# Patient Record
Sex: Female | Born: 1965 | Race: White | Hispanic: No | Marital: Married | Smoking: Former smoker
Health system: Southern US, Community
[De-identification: ages and names within clinical notes are randomized; demographics above are authoritative.]

## PROBLEM LIST (undated history)

## (undated) DIAGNOSIS — E039 Hypothyroidism, unspecified: Secondary | ICD-10-CM

## (undated) DIAGNOSIS — Z973 Presence of spectacles and contact lenses: Secondary | ICD-10-CM

## (undated) DIAGNOSIS — N950 Postmenopausal bleeding: Secondary | ICD-10-CM

## (undated) DIAGNOSIS — Z974 Presence of external hearing-aid: Secondary | ICD-10-CM

## (undated) DIAGNOSIS — H9193 Unspecified hearing loss, bilateral: Secondary | ICD-10-CM

## (undated) DIAGNOSIS — N840 Polyp of corpus uteri: Secondary | ICD-10-CM

## (undated) DIAGNOSIS — H9190 Unspecified hearing loss, unspecified ear: Secondary | ICD-10-CM

## (undated) DIAGNOSIS — E042 Nontoxic multinodular goiter: Secondary | ICD-10-CM

## (undated) HISTORY — DX: Hypothyroidism, unspecified: E03.9

## (undated) HISTORY — PX: BIOPSY THYROID: PRO38

## (undated) HISTORY — DX: Unspecified hearing loss, unspecified ear: H91.90

## (undated) HISTORY — PX: KNEE SURGERY: SHX244

---

## 1998-09-08 ENCOUNTER — Other Ambulatory Visit: Admission: RE | Admit: 1998-09-08 | Discharge: 1998-09-08 | Payer: Self-pay | Admitting: Obstetrics and Gynecology

## 1999-09-12 ENCOUNTER — Other Ambulatory Visit: Admission: RE | Admit: 1999-09-12 | Discharge: 1999-09-12 | Payer: Self-pay | Admitting: Obstetrics and Gynecology

## 2000-03-20 ENCOUNTER — Other Ambulatory Visit: Admission: RE | Admit: 2000-03-20 | Discharge: 2000-03-20 | Payer: Self-pay | Admitting: Gynecology

## 2000-06-19 ENCOUNTER — Other Ambulatory Visit: Admission: RE | Admit: 2000-06-19 | Discharge: 2000-06-19 | Payer: Self-pay | Admitting: Gynecology

## 2000-10-05 ENCOUNTER — Inpatient Hospital Stay (HOSPITAL_COMMUNITY): Admission: AD | Admit: 2000-10-05 | Discharge: 2000-10-08 | Payer: Self-pay | Admitting: Gynecology

## 2001-11-19 ENCOUNTER — Other Ambulatory Visit: Admission: RE | Admit: 2001-11-19 | Discharge: 2001-11-19 | Payer: Self-pay | Admitting: Gynecology

## 2003-04-06 ENCOUNTER — Other Ambulatory Visit: Admission: RE | Admit: 2003-04-06 | Discharge: 2003-04-06 | Payer: Self-pay | Admitting: Gynecology

## 2004-04-19 ENCOUNTER — Other Ambulatory Visit: Admission: RE | Admit: 2004-04-19 | Discharge: 2004-04-19 | Payer: Self-pay | Admitting: Gynecology

## 2005-07-26 ENCOUNTER — Other Ambulatory Visit: Admission: RE | Admit: 2005-07-26 | Discharge: 2005-07-26 | Payer: Self-pay | Admitting: Gynecology

## 2005-08-08 ENCOUNTER — Encounter: Admission: RE | Admit: 2005-08-08 | Discharge: 2005-08-08 | Payer: Self-pay | Admitting: General Surgery

## 2005-08-08 ENCOUNTER — Other Ambulatory Visit: Admission: RE | Admit: 2005-08-08 | Discharge: 2005-08-08 | Payer: Self-pay | Admitting: Diagnostic Radiology

## 2005-08-08 ENCOUNTER — Encounter (INDEPENDENT_AMBULATORY_CARE_PROVIDER_SITE_OTHER): Payer: Self-pay | Admitting: *Deleted

## 2005-08-30 ENCOUNTER — Ambulatory Visit: Payer: Self-pay | Admitting: Endocrinology

## 2005-10-03 ENCOUNTER — Ambulatory Visit: Payer: Self-pay | Admitting: Endocrinology

## 2006-01-03 ENCOUNTER — Ambulatory Visit: Payer: Self-pay | Admitting: Endocrinology

## 2006-01-11 ENCOUNTER — Ambulatory Visit: Payer: Self-pay | Admitting: Endocrinology

## 2006-09-04 HISTORY — PX: OTHER SURGICAL HISTORY: SHX169

## 2006-09-06 ENCOUNTER — Ambulatory Visit (HOSPITAL_BASED_OUTPATIENT_CLINIC_OR_DEPARTMENT_OTHER): Admission: RE | Admit: 2006-09-06 | Discharge: 2006-09-06 | Payer: Self-pay | Admitting: Gynecology

## 2006-09-06 ENCOUNTER — Encounter (INDEPENDENT_AMBULATORY_CARE_PROVIDER_SITE_OTHER): Payer: Self-pay | Admitting: Specialist

## 2006-09-06 HISTORY — PX: HYSTEROSCOPY WITH D & C: SHX1775

## 2006-09-19 ENCOUNTER — Other Ambulatory Visit: Admission: RE | Admit: 2006-09-19 | Discharge: 2006-09-19 | Payer: Self-pay | Admitting: Gynecology

## 2006-10-24 ENCOUNTER — Ambulatory Visit: Payer: Self-pay | Admitting: Endocrinology

## 2006-11-15 ENCOUNTER — Encounter: Admission: RE | Admit: 2006-11-15 | Discharge: 2006-11-15 | Payer: Self-pay | Admitting: Endocrinology

## 2007-06-20 ENCOUNTER — Encounter: Payer: Self-pay | Admitting: Endocrinology

## 2007-06-20 DIAGNOSIS — E039 Hypothyroidism, unspecified: Secondary | ICD-10-CM

## 2007-06-20 HISTORY — DX: Hypothyroidism, unspecified: E03.9

## 2007-10-24 HISTORY — PX: KNEE ARTHROSCOPY: SUR90

## 2009-01-06 ENCOUNTER — Other Ambulatory Visit: Admission: RE | Admit: 2009-01-06 | Discharge: 2009-01-06 | Payer: Self-pay | Admitting: Gynecology

## 2009-01-06 ENCOUNTER — Encounter: Payer: Self-pay | Admitting: Gynecology

## 2009-01-06 ENCOUNTER — Ambulatory Visit: Payer: Self-pay | Admitting: Gynecology

## 2009-01-06 ENCOUNTER — Encounter: Payer: Self-pay | Admitting: Endocrinology

## 2009-01-13 ENCOUNTER — Encounter: Payer: Self-pay | Admitting: Endocrinology

## 2009-02-04 ENCOUNTER — Ambulatory Visit: Payer: Self-pay | Admitting: Endocrinology

## 2009-02-04 LAB — CONVERTED CEMR LAB: TSH: 6.28 microintl units/mL — ABNORMAL HIGH (ref 0.35–5.50)

## 2010-04-15 ENCOUNTER — Ambulatory Visit: Payer: Self-pay | Admitting: Endocrinology

## 2010-05-16 ENCOUNTER — Ambulatory Visit: Payer: Self-pay | Admitting: Endocrinology

## 2010-05-19 LAB — CONVERTED CEMR LAB: TSH: 3.27 microintl units/mL (ref 0.35–5.50)

## 2010-11-22 NOTE — Assessment & Plan Note (Signed)
Summary: YEARLY FU/NWS   Vital Signs:  Patient profile:   45 year old female Height:      63 inches (160.02 cm) Weight:      151 pounds (68.64 kg) BMI:     26.85 O2 Sat:      98 % on Room air Temp:     98.1 degrees F (36.72 degrees C) oral Pulse rate:   51 / minute BP sitting:   114 / 70  (left arm) Cuff size:   regular  Vitals Entered By: Brenton Grills MA (April 15, 2010 10:41 AM)  O2 Flow:  Room air CC: yearly f/u/refill request on levothyroxine/aj   CC:  yearly f/u/refill request on levothyroxine/aj.  History of Present Illness: pt ran out of her synthroid 2 mos ago.  pt states she feels no different, and well in general.  Current Medications (verified): 1)  Levothyroxine Sodium 75 Mcg Tabs (Levothyroxine Sodium) .Marland Kitchen.. 1 Qd  Allergies (verified): No Known Drug Allergies  Past History:  Past Medical History: Last updated: 06/20/2007 Hypothyroidism  Review of Systems       no fatigue  Physical Exam  General:  normal appearance.   Neck:  there is slight prominence at the left thyroid, but i do not appreciate a nodule   Impression & Recommendations:  Problem # 1:  HYPOTHYROIDISM (ICD-244.9) therapy limited by noncompliance.  i'll do the best i can.  Other Orders: Est. Patient Level III (16109)  Patient Instructions: 1)  resume levothyroxine 75 micrograms/day. 2)  in 1 month, go to lab for tsh 244.9.  please call [phone] to hear your test results. 3)  Please schedule a follow-up appointment in 1 year. 4)  cc dr Lily Peer. Prescriptions: LEVOTHYROXINE SODIUM 75 MCG TABS (LEVOTHYROXINE SODIUM) 1 qd  #90 x 3   Entered and Authorized by:   Minus Breeding MD   Signed by:   Minus Breeding MD on 04/15/2010   Method used:   Electronically to        [facility]. [phone]* (retail)       [id number]       [place], [place]  [address]       Ph: [phone]       Fax: [phone]   RxID:   [id number]

## 2011-03-10 NOTE — Discharge Summary (Signed)
[facility]  Patient:    Kimberly Hill, Kimberly Hill                       MRN: [id number] Adm. Date:  [id number] Disc. Date: [id number] Attending:  Tonye Royalty Dictator:   Antony Contras, [facility]                           Discharge Summary  DISCHARGE DIAGNOSES:          1. Intrauterine pregnancy at 39+ weeks.                               2. History of advanced maternal age.                               3. History of bilateral hearing deficit.  PROCEDURES:                   Normal spontaneous vaginal delivery of viable                               infant over midline episiotomy.  HISTORY OF PRESENT ILLNESS:   Patient is a 45 year old gravida 2, para 1-0-0-1 with LMP January 05, 2000, [facility] October 11, 2000. Prenatal risk factors included advanced maternal age, bilateral hearing deficit.  PRENATAL LABORATORY DATA:     Blood type A positive, antibody screen negative. RPR, HBSAG, and HIV nonreactive. Patient did decline both MSAFP and amniocentesis. GBS is negative.  HOSPITAL COURSE/TREATMENT:    Patient was admitted on October 06, 2000 with spontaneous onset of labor. Cervix was 3-4 cm, 90%, and -1 station. She did progress to complete dilatation and was delivered on an Apgar 8/9 female infant weighing 8 pounds 4 ounces over a midline episiotomy.  POSTPARTUM COURSE:            Normal except for gestational thrombocytopenia. CBC: hematocrit 30.9, hemoglobin 11.2, platelets 102,000, but she remained afebrile and had no difficulty voiding, and was able to be discharged on her second postpartum day in satisfactory condition.  DISPOSITION:                  Follow up in six weeks. Continue prenatal vitamins and iron. Motrin/Tylox for pain. Also, will check platelets at postpartum visit with postpartum CBC. DD:  11/05/00 TD:  11/05/00 Job: [id number]

## 2011-03-10 NOTE — Op Note (Signed)
Kimberly Hill, Kimberly Hill              ACCOUNT NO.:  [id number]   MEDICAL RECORD NO.:  [id number]          PATIENT TYPE:  AMB   LOCATION:  [facility]                         FACILITY:  [facility]   PHYSICIAN:  Juan H. Lily Peer, M.D.DATE OF BIRTH:  [birth year]   DATE OF PROCEDURE:  09/06/2006  DATE OF DISCHARGE:                                 OPERATIVE REPORT   SURGEON:  Juan H. Lily Peer, M.D.   INDICATIONS FOR OPERATION:  45 year old gravida 2, para 2 with  menomenorrhagia.  Workup consisting of a benign endometrial biopsy but a  sonohysterogram demonstrated two endometrial polyps.   PREOPERATIVE DIAGNOSIS:  1. Menomenorrhagia.  2. Endometrial polyps.   POSTOPERATIVE DIAGNOSIS:  1. Menomenorrhagia.  2. Endometrial polyps.   ANESTHESIA:  General endotracheal anesthesia.   PROCEDURE PERFORMED:  1. Resectoscopic polypectomy.  2. Excision of a cervical polyp.   FINDINGS:  The patient had a small polyp protruding through the external  cervical os and had two larger polyps in the endometrial cavity.  One near  the lower uterine segment and the other one in midportion of the uterus  measured approximately to 2.5 cm.  Both tubal ostia were identified.   DESCRIPTION OF OPERATION:  After the patient was adequately counseled she  was taken to the operating room where she underwent general endotracheal  anesthesia. She had received a gram of cefoxitin for prophylaxis.  The  vagina and perineum were prepped and draped in usual sterile fashion.  A red  rubber Roxan Hockey had been utilized to evacuate the bladder of its contents  for approximately 75 mL.  Examination under anesthesia demonstrated  anteverted uterus with no palpable adnexal masses.  The short weighted  billed speculum was placed in a posterior vaginal vault and a single-tooth  tenaculum was placed on the anterior cervical lip.  Previously placed  laminaria was removed thus requiring minimal dilatation with a Pratt dilator  to size 27.   Following this, the operative resectoscope with a 90 degrees  wire loop was inserted into the intrauterine cavity.  3% sorbitol was  distending media.  Findings as described above.  The [facility]  surgical generator was set at 80 watts on cutting and 80 watts on  coagulation and the individual polyps were excised as well as the one in the  cervical canal.  All these were submitted for pathological evaluation.  The  uterus was then curetted vigorously with a blunt curette and followed by  suction curette to remove all the fragments.  Inspection of the cavity  demonstrated clear cavity with complete removal of all  the polyps.  The patient tolerated procedure well, was extubated and  transferred to recovery with stable vital signs.  Blood loss was minimal and  fluid resuscitation consisted of 1100 mL of lactated Ringer's.  Fluid  deficit was 50 mL.  The patient to receive Toradol 30 mg IV en route to the  recovery room.      Juan H. Lily Peer, M.D.  Electronically Signed     JHF/MEDQ  D:  09/06/2006  T:  09/06/2006  Job:  [id number]

## 2011-03-10 NOTE — H&P (Signed)
Kimberly Hill, PIETSCH              ACCOUNT NO.:  [id number]   MEDICAL RECORD NO.:  [id number]          PATIENT TYPE:  AMB   LOCATION:  [facility]                         FACILITY:  [facility]   PHYSICIAN:  Juan H. Lily Peer, M.D.DATE OF BIRTH:  [birth year]   DATE OF ADMISSION:  09/06/2006  DATE OF DISCHARGE:                                HISTORY & PHYSICAL   The patient is scheduled for surgery on Thursday, September 06, 2006, at 7:30  a.m. at [facility].  Please have History and Physical  available.   CHIEF COMPLAINT:  1. Dysfunctional uterine bleeding.  2. Endometrial polyp.   HISTORY:  The patient is a 45 year old, gravida 2, para 2, who stopped oral  contraceptive pills last year.  She is using Customer service manager contraception. Was  evaluated for dysfunctional uterine bleeding.  Had a TSH and prolactin  evaluated. She is being followed by endocrinologist for her hypothyroidism  for which she was on Synthroid 50 mcg daily.  Her evaluation had consisted  of an endometrial biopsy which was done in the office on August 24 of this  year which demonstrated proliferative secretory glands with shedding  consistent with __________ endometrium, no evidence of hyperplasia or  malignancy.  She subsequently underwent a sonohistogram in the office which  had demonstrated in September 2007 two endometrial polyps, one measuring 27  x 26 mm and the second one 10 x 7 mm in the lower uterine segment. She did  have a left complex cyst with septation in the left ovary measuring 25 x 17  x 22 mm, and subsequently on the ultrasound done today, complete resolution  of the ovarian cyst, and the ovaries were normal with exception of the  endometrial polyp.  The patient is scheduled to undergo resectoscopic  polypectomy tomorrow, November 15, at [facility].   PAST MEDICAL HISTORY:  She denies any allergies.  She has had 2 normal  spontaneous vaginal deliveries.  She has a history of  hypothyroidism on  Synthroid 50 mcg daily.  Prior surgeries have consisted of knee surgery,  thyroid needle biopsy which was benign, and resectoscopic polypectomy.  The  patient has decreased hearing in both ears and uses hearing aid as well.   FAMILY HISTORY:  Father with history of hypertension.   PHYSICAL EXAMINATION:  GENERAL:  Well-developed, well-nourished female.  VITAL SIGNS:  Average weight 132 pounds. Blood pressure 120/70.  HEENT:  Unremarkable.  NECK:  Supple.  Trachea midline.  No carotid bruits, no thyromegaly.  LUNGS: Clear to auscultation without rhonchi or wheezes.  HEART: Regular rate and rhythm.  No murmurs, rubs, or gallops.  BREASTS: Exam not done.  ABDOMEN: Soft, nontender without rebound or guarding.  PELVIC:  Bartholin, urethra, Skene within normal limits.  Vagina and cervix:  No lesions or discharge.  Uterus anteverted, normal size, shape and  consistency.  Adnexa without mass or tenderness.  RECTAL: Exam deferred.   ASSESSMENT:  A 45 year old gravida 2, para 2, with Bayer contraception,  history of hypothyroidism on Synthroid 50 mcg daily with dysfunctional  uterine bleeding.  Workup demonstrated normal endometrial  biopsy.  Sonohistogram demonstrated intracavitary polyps, one measuring 27 x 26 mm  and the second one 10 x 7 mm.  The patient had laminaria placed in the  office today. She was given a prescription for pain medication such as  Lortab 7.5/500 to take 1 p.o. q. 4-6 h p.r.n. if she needs it tonight and  then after her surgery tomorrow.  Risks, benefits, pros and cons were  discussed.  She is scheduled to undergo resectoscopic polypectomy at [facility].  All questions were answered and will follow  accordingly.   PLAN:  As discussed above.      Juan H. Lily Peer, M.D.  Electronically Signed     JHF/MEDQ  D:  09/05/2006  T:  09/05/2006  Job:  [id number]

## 2012-03-06 ENCOUNTER — Ambulatory Visit: Payer: Self-pay | Admitting: Endocrinology

## 2012-05-23 ENCOUNTER — Encounter: Payer: Self-pay | Admitting: Endocrinology

## 2012-06-21 ENCOUNTER — Ambulatory Visit (INDEPENDENT_AMBULATORY_CARE_PROVIDER_SITE_OTHER): Payer: BC Managed Care – PPO | Admitting: Endocrinology

## 2012-06-21 ENCOUNTER — Other Ambulatory Visit (INDEPENDENT_AMBULATORY_CARE_PROVIDER_SITE_OTHER): Payer: BC Managed Care – PPO

## 2012-06-21 ENCOUNTER — Encounter: Payer: Self-pay | Admitting: Endocrinology

## 2012-06-21 VITALS — BP 114/80 | HR 48 | Temp 97.9°F | Ht 63.0 in | Wt 156.0 lb

## 2012-06-21 DIAGNOSIS — E039 Hypothyroidism, unspecified: Secondary | ICD-10-CM

## 2012-06-21 DIAGNOSIS — E042 Nontoxic multinodular goiter: Secondary | ICD-10-CM | POA: Insufficient documentation

## 2012-06-21 DIAGNOSIS — H9190 Unspecified hearing loss, unspecified ear: Secondary | ICD-10-CM

## 2012-06-21 MED ORDER — LEVOTHYROXINE SODIUM 50 MCG PO TABS: 50.0000 ug | ORAL_TABLET | Freq: Every day | ORAL | Status: DC

## 2012-06-21 NOTE — Patient Instructions (Addendum)
blood tests are being requested for you today.  You will receive a letter with results. Based on the results, i will probably prescribe for you a refill of the levothyroxine.

## 2012-06-21 NOTE — Progress Notes (Signed)
  Subjective:    Patient ID: Kimberly Hill, female    DOB: 06-03-1966, 46 y.o.   MRN: [id number]  HPI Pt returns for f/u of chronic hypothyroidism (dx'ed 2008).  pt states she feels well in general.  She has not recently taken synthroid.  She has regular menses. Past Medical History  Diagnosis Date  . HYPOTHYROIDISM 06/20/2007    Qualifier: Diagnosis of  By: Dance CMA (AAMA), Kim      No past surgical history on file.  History   Social History  . Marital Status: Married    Spouse Name: N/A    Number of Children: N/A  . Years of Education: N/A   Occupational History  . Not on file.   Social History Main Topics  . Smoking status: Former Games developer  . Smokeless tobacco: Not on file  . Alcohol Use: Not on file  . Drug Use: Not on file  . Sexually Active: Not on file   Other Topics Concern  . Not on file   Social History Narrative  . No narrative on file    Current Outpatient Prescriptions on File Prior to Visit  Medication Sig Dispense Refill  . levothyroxine (SYNTHROID, LEVOTHROID) 50 MCG tablet Take 1 tablet (50 mcg total) by mouth daily.  30 tablet  11    No Known Allergies  No family history on file.  BP 114/80  Pulse 48  Temp 97.9 F (36.6 C) (Oral)  Ht 5\' 3"  (1.6 m)  Wt 156 lb (70.761 kg)  BMI 27.63 kg/m2  SpO2 97%  LMP 05/23/2012    Review of Systems She denies fatigue.    Objective:   Physical Exam VITAL SIGNS:  See vs page GENERAL: no distress NECK: There is no palpable thyroid enlargement.  No thyroid nodule is palpable.  No palpable lymphadenopathy at the anterior neck.  Lab Results  Component Value Date   TSH 10.91* 06/21/2012      Assessment & Plan:  Chronic hypothyroidism, therapy limited by noncompliance.  i'll do the best i can.

## 2012-07-24 ENCOUNTER — Other Ambulatory Visit: Payer: Self-pay | Admitting: Endocrinology

## 2012-07-24 ENCOUNTER — Other Ambulatory Visit (INDEPENDENT_AMBULATORY_CARE_PROVIDER_SITE_OTHER): Payer: BC Managed Care – PPO

## 2012-07-24 DIAGNOSIS — Z0000 Encounter for general adult medical examination without abnormal findings: Secondary | ICD-10-CM

## 2012-07-24 LAB — TSH: TSH: 5.89 u[IU]/mL — ABNORMAL HIGH (ref 0.35–5.50)

## 2012-07-24 MED ORDER — LEVOTHYROXINE SODIUM 75 MCG PO TABS: 75.0000 ug | ORAL_TABLET | Freq: Every day | ORAL | Status: DC

## 2012-11-07 ENCOUNTER — Encounter: Payer: Self-pay | Admitting: Anesthesiology

## 2012-11-12 ENCOUNTER — Encounter: Payer: Self-pay | Admitting: Gynecology

## 2012-11-12 ENCOUNTER — Ambulatory Visit (INDEPENDENT_AMBULATORY_CARE_PROVIDER_SITE_OTHER): Payer: BC Managed Care – PPO | Admitting: Gynecology

## 2012-11-12 ENCOUNTER — Other Ambulatory Visit (HOSPITAL_COMMUNITY)
Admission: RE | Admit: 2012-11-12 | Discharge: 2012-11-12 | Disposition: A | Discharge status: Home / Self Care | Payer: BC Managed Care – PPO | Source: Ambulatory Visit | Attending: Gynecology | Admitting: Gynecology

## 2012-11-12 VITALS — BP 124/82 | Ht 62.0 in | Wt 154.0 lb

## 2012-11-12 DIAGNOSIS — Z01419 Encounter for gynecological examination (general) (routine) without abnormal findings: Secondary | ICD-10-CM | POA: Insufficient documentation

## 2012-11-12 DIAGNOSIS — Z1151 Encounter for screening for human papillomavirus (HPV): Secondary | ICD-10-CM | POA: Insufficient documentation

## 2012-11-12 DIAGNOSIS — R635 Abnormal weight gain: Secondary | ICD-10-CM

## 2012-11-12 LAB — CBC WITH DIFFERENTIAL/PLATELET
Basophils Absolute: 0 10*3/uL (ref 0.0–0.1)
Basophils Relative: 0 % (ref 0–1)
Eosinophils Absolute: 0 10*3/uL (ref 0.0–0.7)
Eosinophils Relative: 1 % (ref 0–5)
HCT: 40.5 % (ref 36.0–46.0)
Hemoglobin: 13.7 g/dL (ref 12.0–15.0)
Lymphocytes Relative: 18 % (ref 12–46)
Lymphs Abs: 1.3 10*3/uL (ref 0.7–4.0)
MCH: 30 pg (ref 26.0–34.0)
MCHC: 33.8 g/dL (ref 30.0–36.0)
MCV: 88.6 fL (ref 78.0–100.0)
Monocytes Absolute: 0.4 10*3/uL (ref 0.1–1.0)
Monocytes Relative: 6 % (ref 3–12)
Neutro Abs: 5.2 10*3/uL (ref 1.7–7.7)
Neutrophils Relative %: 75 % (ref 43–77)
Platelets: 221 10*3/uL (ref 150–400)
RBC: 4.57 MIL/uL (ref 3.87–5.11)
RDW: 13.9 % (ref 11.5–15.5)
WBC: 7 10*3/uL (ref 4.0–10.5)

## 2012-11-12 LAB — GLUCOSE, RANDOM: Glucose, Bld: 90 mg/dL (ref 70–99)

## 2012-11-12 LAB — LIPID PANEL
Cholesterol: 199 mg/dL (ref 0–200)
HDL: 57 mg/dL (ref >39)
LDL Cholesterol: 124 mg/dL — ABNORMAL HIGH (ref 0–99)
Total CHOL/HDL Ratio: 3.5 Ratio
Triglycerides: 89 mg/dL (ref <150)
VLDL: 18 mg/dL (ref 0–40)

## 2012-11-12 NOTE — Patient Instructions (Addendum)

## 2012-11-12 NOTE — Progress Notes (Signed)
Kimberly Hill 20-Mar-1966 [id number]   History:    47 y.o.  for annual gyn exam who has not been seen the office since 2010. Review of her record indicated that she was being followed by Dr. Everardo All endocrinologist for her multinodular goiter. Patient with past history of benign thyroid biopsy. She is currently on Synthroid 75 mcg daily. She had her thyroid medication adjusted and seen by her endocrinologist and is scheduled to see him in March of this year. Her last mammogram was 4 years ago. Patient with no past history of abnormal Pap smears. Patient declined flu vaccine. Patient is not using any form of contraception. Patient with past history resectoscopic polypectomy in 2007.  Past medical history,surgical history, family history and social history were all reviewed and documented in the EPIC chart.  Gynecologic History Patient's last menstrual period was 10/29/2012. Contraception: none Last Pap: 2010. Results were: normal Last mammogram: 2010. Results were: normal  Obstetric History OB History    Grav Para Term Preterm Abortions TAB SAB Ect Mult Living   2 2        2      # Outc Date GA Lbr Len/2nd Wgt Sex Del Anes PTL Lv   1 PAR            2 PAR                ROS: A ROS was performed and pertinent positives and negatives are included in the history.  GENERAL: No fevers or chills. HEENT: No change in vision, no earache, sore throat or sinus congestion. NECK: No pain or stiffness. CARDIOVASCULAR: No chest pain or pressure. No palpitations. PULMONARY: No shortness of breath, cough or wheeze. GASTROINTESTINAL: No abdominal pain, nausea, vomiting or diarrhea, melena or bright red blood per rectum. GENITOURINARY: No urinary frequency, urgency, hesitancy or dysuria. MUSCULOSKELETAL: No joint or muscle pain, no back pain, no recent trauma. DERMATOLOGIC: No rash, no itching, no lesions. ENDOCRINE: No polyuria, polydipsia, no heat or cold intolerance. No recent change in weight. HEMATOLOGICAL:  No anemia or easy bruising or bleeding. NEUROLOGIC: No headache, seizures, numbness, tingling or weakness. PSYCHIATRIC: No depression, no loss of interest in normal activity or change in sleep pattern.     Exam: chaperone present  BP 124/82  Ht 5\' 2"  (1.575 m)  Wt 154 lb (69.854 kg)  BMI 28.17 kg/m2  LMP 10/29/2012  Body mass index is 28.17 kg/(m^2).  General appearance : Well developed well nourished female. No acute distress HEENT: Neck supple, trachea midline, no carotid bruits, no thyroidmegaly Lungs: Clear to auscultation, no rhonchi or wheezes, or rib retractions  Heart: Regular rate and rhythm, no murmurs or gallops Breast:Examined in sitting and supine position were symmetrical in appearance, no palpable masses or tenderness,  no skin retraction, no nipple inversion, no nipple discharge, no skin discoloration, no axillary or supraclavicular lymphadenopathy Abdomen: no palpable masses or tenderness, no rebound or guarding Extremities: no edema or skin discoloration or tenderness  Pelvic:  Bartholin, Urethra, Skene Glands: Within normal limits             Vagina: No gross lesions or discharge  Cervix: No gross lesions or discharge  Uterus  anteverted, normal size, shape and consistency, non-tender and mobile  Adnexa  Without masses or tenderness  Anus and perineum  normal   Rectovaginal  normal sphincter tone without palpated masses or tenderness             Hemoccult not indicated  Assessment/Plan:  47 y.o. female for annual exam overdue for mammogram. Requisition was provided for her to schedule. Patient was reminded on the importance of monthly self breast examination. Pap smear done today. The following labs were also ordered: CBC, fasting blood sugar, fasting lipid profile, TSH and urinalysis. We discussed importance of regular exercise as well as calcium vitamin D for osteoporosis prevention. Patient declined flu vaccine. Literature information on the dTap Vaccine was  provided. She is having normal menstrual cycles. She will continue to followup with her endocrinologist for her hypothyroidism.    Ok Edwards MD, 1:01 PM 11/12/2012

## 2012-11-13 LAB — URINALYSIS W MICROSCOPIC + REFLEX CULTURE
Bilirubin Urine: NEGATIVE
Casts: NONE SEEN
Crystals: NONE SEEN
Glucose, UA: NEGATIVE mg/dL
Hgb urine dipstick: NEGATIVE
Leukocytes, UA: NEGATIVE
Nitrite: NEGATIVE
Protein, ur: NEGATIVE mg/dL
Specific Gravity, Urine: 1.028 (ref 1.005–1.030)
Urobilinogen, UA: 0.2 mg/dL (ref 0.0–1.0)
pH: 5.5 (ref 5.0–8.0)

## 2012-11-13 LAB — TSH: TSH: 11.593 u[IU]/mL — ABNORMAL HIGH (ref 0.350–4.500)

## 2012-11-14 LAB — URINE CULTURE: Colony Count: 15000

## 2012-11-15 ENCOUNTER — Other Ambulatory Visit: Payer: Self-pay | Admitting: Gynecology

## 2012-11-15 MED ORDER — FLUCONAZOLE 100 MG PO TABS: 100.0000 mg | ORAL_TABLET | Freq: Every day | ORAL | Status: DC

## 2012-11-18 ENCOUNTER — Encounter: Payer: Self-pay | Admitting: Gynecology

## 2012-11-18 ENCOUNTER — Telehealth: Payer: Self-pay

## 2012-11-18 NOTE — Telephone Encounter (Signed)
Please advise ov 

## 2012-11-18 NOTE — Telephone Encounter (Signed)
Pt had recent lab work at her Gyn office and was told her thyroid was underactive and to call here to let us know.

## 2012-11-21 ENCOUNTER — Encounter: Payer: Self-pay | Admitting: Gynecology

## 2012-11-25 ENCOUNTER — Encounter: Payer: Self-pay | Admitting: Endocrinology

## 2012-11-25 ENCOUNTER — Ambulatory Visit (INDEPENDENT_AMBULATORY_CARE_PROVIDER_SITE_OTHER): Payer: BC Managed Care – PPO | Admitting: Endocrinology

## 2012-11-25 VITALS — BP 126/78 | HR 68 | Wt 156.0 lb

## 2012-11-25 DIAGNOSIS — E039 Hypothyroidism, unspecified: Secondary | ICD-10-CM

## 2012-11-25 NOTE — Progress Notes (Signed)
  Subjective:    Patient ID: Kimberly Hill, female    DOB: 11-Jul-1966, 47 y.o.   MRN: [id number]  HPI Pt returns for f/u of chronic hypothyroidism (dx'ed 2006).  She had multinodular goiter on [place].  bx of bilat nodules in 2006 showed follicular epithelium.  She was recently noted to have an elevated TSH.  She thinks her synthroid bottle says only 50 mcg, but she is uncertain.  She has chronic weight gain. Past Medical History  Diagnosis Date  . HYPOTHYROIDISM 06/20/2007    Qualifier: Diagnosis of  By: Dance CMA (AAMA), Kim    . NSVD (normal spontaneous vaginal delivery)     x2    Past Surgical History  Procedure Date  . Knee surgery   . Biopsy thyroid     B-9  . Resectoscopic polypectomy 09/04/2006    History   Social History  . Marital Status: Married    Spouse Name: N/A    Number of Children: N/A  . Years of Education: N/A   Occupational History  . Not on file.   Social History Main Topics  . Smoking status: Former Smoker    Quit date: 11/12/1988  . Smokeless tobacco: Never Used  . Alcohol Use: No  . Drug Use: No  . Sexually Active: Yes    Birth Control/ Protection: None   Other Topics Concern  . Not on file   Social History Narrative  . No narrative on file    Current Outpatient Prescriptions on File Prior to Visit  Medication Sig Dispense Refill  . fluconazole (DIFLUCAN) 100 MG tablet Take 1 tablet (100 mg total) by mouth daily.  1 tablet  0  . levothyroxine (SYNTHROID, LEVOTHROID) 75 MCG tablet Take 1 tablet (75 mcg total) by mouth daily.  30 tablet  5    No Known Allergies  Family History  Problem Relation Age of Onset  . Hypertension Father     BP 126/78  Pulse 68  Wt 156 lb (70.761 kg)  SpO2 96%  LMP 10/29/2012  Review of Systems She has regular menses.    Objective:   Physical Exam VITAL SIGNS:  See vs page GENERAL: no distress NECK: There is no palpable thyroid enlargement.  A 1-2 cm left thyroid nodule is palpable.  No palpable  lymphadenopathy at the anterior neck.     Assessment & Plan:  Hypothyroidism.  She is on an uncertain dosage of synthroid

## 2012-11-25 NOTE — Patient Instructions (Addendum)
Please call us back to verify the amount of thyroid medication you take.   Please return in 1 year.

## 2012-11-26 ENCOUNTER — Telehealth: Payer: Self-pay

## 2012-11-26 NOTE — Telephone Encounter (Signed)
Pt called to let you know dose Levothyroxine should be 50 mcg and to get lab results [phone].

## 2012-11-29 ENCOUNTER — Telehealth: Payer: Self-pay | Admitting: *Deleted

## 2012-11-29 MED ORDER — LEVOTHYROXINE SODIUM 75 MCG PO TABS: 75.0000 ug | ORAL_TABLET | Freq: Every day | ORAL | Status: DC

## 2012-11-29 NOTE — Telephone Encounter (Signed)
Please increase to 75 mcg/d i have sent a prescription to your pharmacy

## 2012-11-29 NOTE — Telephone Encounter (Signed)
Pt advised and states an understanding 

## 2012-11-29 NOTE — Telephone Encounter (Signed)
PATIENT CALLED AGAIN TO LET YOU KNOW OF LEVOTHYROXINE DOSE SHE IS CURRENTLY ON IS . SHE WAS UNSURE IF TO STAY ON THIS DOSE OR WERE YOU TO INCREASE HER DOSE TO 75 MCG. PLEASE ADVISE . HOME # [phone]  CELL # [phone].

## 2014-08-24 ENCOUNTER — Encounter: Payer: Self-pay | Admitting: Endocrinology

## 2015-11-30 ENCOUNTER — Encounter: Payer: Self-pay | Admitting: Gynecology

## 2015-12-21 ENCOUNTER — Ambulatory Visit: Payer: Self-pay | Admitting: Gynecology

## 2016-01-04 ENCOUNTER — Ambulatory Visit: Payer: Self-pay | Admitting: Gynecology

## 2016-01-25 ENCOUNTER — Encounter: Payer: Self-pay | Admitting: Women's Health

## 2016-01-25 ENCOUNTER — Ambulatory Visit (INDEPENDENT_AMBULATORY_CARE_PROVIDER_SITE_OTHER): Payer: BLUE CROSS/BLUE SHIELD | Admitting: Women's Health

## 2016-01-25 VITALS — BP 118/78 | Ht 62.0 in | Wt 169.0 lb

## 2016-01-25 DIAGNOSIS — Z13220 Encounter for screening for lipoid disorders: Secondary | ICD-10-CM | POA: Diagnosis not present

## 2016-01-25 DIAGNOSIS — Z01419 Encounter for gynecological examination (general) (routine) without abnormal findings: Secondary | ICD-10-CM

## 2016-01-25 DIAGNOSIS — E038 Other specified hypothyroidism: Secondary | ICD-10-CM | POA: Diagnosis not present

## 2016-01-25 LAB — LIPID PANEL
CHOL/HDL RATIO: 4.3 ratio (ref <=5.0)
CHOLESTEROL: 209 mg/dL — AB (ref 125–200)
HDL: 49 mg/dL (ref >=46)
LDL Cholesterol: 124 mg/dL (ref <130)
TRIGLYCERIDES: 178 mg/dL — AB (ref <150)
VLDL: 36 mg/dL — AB (ref <30)

## 2016-01-25 LAB — CBC WITH DIFFERENTIAL/PLATELET
BASOS ABS: 0 {cells}/uL (ref 0–200)
Basophils Relative: 0 %
EOS ABS: 320 {cells}/uL (ref 15–500)
Eosinophils Relative: 5 %
HCT: 39.5 % (ref 35.0–45.0)
Hemoglobin: 13.1 g/dL (ref 11.7–15.5)
Lymphocytes Relative: 22 %
Lymphs Abs: 1408 cells/uL (ref 850–3900)
MCH: 29.6 pg (ref 27.0–33.0)
MCHC: 33.2 g/dL (ref 32.0–36.0)
MCV: 89.4 fL (ref 80.0–100.0)
MPV: 10.3 fL (ref 7.5–12.5)
Monocytes Absolute: 448 cells/uL (ref 200–950)
Monocytes Relative: 7 %
Neutro Abs: 4224 cells/uL (ref 1500–7800)
Neutrophils Relative %: 66 %
Platelets: 228 10*3/uL (ref 140–400)
RBC: 4.42 MIL/uL (ref 3.80–5.10)
RDW: 14 % (ref 11.0–15.0)
WBC: 6.4 10*3/uL (ref 3.8–10.8)

## 2016-01-25 LAB — COMPREHENSIVE METABOLIC PANEL
ALT: 18 U/L (ref 6–29)
AST: 18 U/L (ref 10–35)
Albumin: 4.1 g/dL (ref 3.6–5.1)
Alkaline Phosphatase: 54 U/L (ref 33–130)
BUN: 10 mg/dL (ref 7–25)
CHLORIDE: 105 mmol/L (ref 98–110)
CO2: 28 mmol/L (ref 20–31)
Calcium: 9.5 mg/dL (ref 8.6–10.4)
Creat: 0.79 mg/dL (ref 0.50–1.05)
Glucose, Bld: 86 mg/dL (ref 65–99)
POTASSIUM: 4.2 mmol/L (ref 3.5–5.3)
Sodium: 141 mmol/L (ref 135–146)
Total Bilirubin: 0.3 mg/dL (ref 0.2–1.2)
Total Protein: 6.6 g/dL (ref 6.1–8.1)

## 2016-01-25 LAB — TSH: TSH: 12.9 mIU/L — ABNORMAL HIGH

## 2016-01-25 LAB — T3 UPTAKE: T3 Uptake: 26 % (ref 22–35)

## 2016-01-25 LAB — T4: T4, Total: 5.8 ug/dL (ref 4.5–12.0)

## 2016-01-25 NOTE — Progress Notes (Signed)
Kimberly Hill August 26, 1966 [id number]    History:    Presents for annual exam.  Monthly cycle using no contraception has not conceived in greater than 15 years. History of a goiter had been on Synthroid 75 g until approximately 2 years ago when did not follow-up with endocrinologist. Benign thyroid biopsy in the past. Normal Pap and mammogram history. Last mammogram 2014. Hearing loss.  Past medical history, past surgical history, family history and social history were all reviewed and documented in the EPIC chart. 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 12 year old son in college, doing well, 2 year old daughter doing well. Son has had gardasil. Father hypertension.  ROS:  A ROS was performed and pertinent positives and negatives are included.  Exam:  Filed Vitals:   01/25/16 1408  BP: 118/78    General appearance:  Normal Thyroid:  Symmetrical, normal in size, without palpable masses or nodularity. Respiratory  Auscultation:  Clear without wheezing or rhonchi Cardiovascular  Auscultation:  Regular rate, without rubs, murmurs or gallops  Edema/varicosities:  Not grossly evident Abdominal  Soft,nontender, without masses, guarding or rebound.  Liver/spleen:  No organomegaly noted  Hernia:  None appreciated  Skin  Inspection:  Grossly normal   Breasts: Examined lying and sitting.     Right: Without masses, retractions, discharge or axillary adenopathy.     Left: Without masses, retractions, discharge or axillary adenopathy. Gentitourinary   Inguinal/mons:  Normal without inguinal adenopathy  External genitalia:  Normal  BUS/Urethra/Skene's glands:  Normal  Vagina:  Normal  Cervix:  Normal  Uterus:  normal in size, shape and contour.  Midline and mobile  Adnexa/parametria:     Rt: Without masses or tenderness.   Lt: Without masses or tenderness.  Anus and perineum: Normal  Digital rectal exam: Normal sphincter tone without palpated masses or  tenderness  Assessment/Plan:  50 y.o. MWF G2 P2 for annual examWith no complaints.     Monthly cycle/no contraception/no pregnancy in 15 years Goiter /hypothyroid has been off Synthroid for 2 years  Plan: Contraception reviewed and declines. CBC, CMP, lipid panel, thyroid panel, UA, Pap with HR HPV typing. New screening guidelines reviewed. 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 Screening colonoscopy reviewed, [facility] information given instructed to schedule.  [place], 5:02 PM 01/25/2016

## 2016-01-25 NOTE — Patient Instructions (Signed)
Colonoscopy  Dr Carlean Purl  Or Dr Hilarie Fredrickson   [phone]   Health Maintenance, Female Adopting a healthy lifestyle and getting preventive care can go a long way to promote health and wellness. Talk with your health care provider about what schedule of regular examinations is right for you. This is a good chance for you to check in with your provider about disease prevention and staying healthy. In between checkups, there are plenty of things you can do on your own. Experts have done a lot of research about which lifestyle changes and preventive measures are most likely to keep you healthy. Ask your health care provider for more information. WEIGHT AND DIET  Eat a healthy diet  Be sure to include plenty of vegetables, fruits, low-fat dairy products, and lean protein.  Do not eat a lot of foods high in solid fats, added sugars, or salt.  Get regular exercise. This is one of the most important things you can do for your health.  Most adults should exercise for at least 150 minutes each week. The exercise should increase your heart rate and make you sweat (moderate-intensity exercise).  Most adults should also do strengthening exercises at least twice a week. This is in addition to the moderate-intensity exercise.  Maintain a healthy weight  Body mass index (BMI) is a measurement that can be used to identify possible weight problems. It estimates body fat based on height and weight. Your health care provider can help determine your BMI and help you achieve or maintain a healthy weight.  For females 81 years of age and older:   A BMI below 18.5 is considered underweight.  A BMI of 18.5 to 24.9 is normal.  A BMI of 25 to 29.9 is considered overweight.  A BMI of 30 and above is considered obese.  Watch levels of cholesterol and blood lipids  You should start having your blood tested for lipids and cholesterol at 51 years of age, then have this test every 5 years.  You may need to have your  cholesterol levels checked more often if:  Your lipid or cholesterol levels are high.  You are older than 51 years of age.  You are at high risk for heart disease.  CANCER SCREENING   Lung Cancer  Lung cancer screening is recommended for adults 64-12 years old who are at high risk for lung cancer because of a history of smoking.  A yearly low-dose CT scan of the lungs is recommended for people who:  Currently smoke.  Have quit within the past 15 years.  Have at least a 30-pack-year history of smoking. A pack year is smoking an average of one pack of cigarettes a day for 1 year.  Yearly screening should continue until it has been 15 years since you quit.  Yearly screening should stop if you develop a health problem that would prevent you from having lung cancer treatment.  Breast Cancer  Practice breast self-awareness. This means understanding how your breasts normally appear and feel.  It also means doing regular breast self-exams. Let your health care provider know about any changes, no matter how small.  If you are in your 20s or 30s, you should have a clinical breast exam (CBE) by a health care provider every 1-3 years as part of a regular health exam.  If you are 32 or older, have a CBE every year. Also consider having a breast X-ray (mammogram) every year.  If you have a family history of breast  cancer, talk to your health care provider about genetic screening.  If you are at high risk for breast cancer, talk to your health care provider about having an MRI and a mammogram every year.  Breast cancer gene (BRCA) assessment is recommended for women who have family members with BRCA-related cancers. BRCA-related cancers include:  Breast.  Ovarian.  Tubal.  Peritoneal cancers.  Results of the assessment will determine the need for genetic counseling and BRCA1 and BRCA2 testing. Cervical Cancer Your health care provider may recommend that you be screened regularly  for cancer of the pelvic organs (ovaries, uterus, and vagina). This screening involves a pelvic examination, including checking for microscopic changes to the surface of your cervix (Pap test). You may be encouraged to have this screening done every 3 years, beginning at age 69.  For women ages 47-65, health care providers may recommend pelvic exams and Pap testing every 3 years, or they may recommend the Pap and pelvic exam, combined with testing for human papilloma virus (HPV), every 5 years. Some types of HPV increase your risk of cervical cancer. Testing for HPV may also be done on women of any age with unclear Pap test results.  Other health care providers may not recommend any screening for nonpregnant women who are considered low risk for pelvic cancer and who do not have symptoms. Ask your health care provider if a screening pelvic exam is right for you.  If you have had past treatment for cervical cancer or a condition that could lead to cancer, you need Pap tests and screening for cancer for at least 20 years after your treatment. If Pap tests have been discontinued, your risk factors (such as having a new sexual partner) need to be reassessed to determine if screening should resume. Some women have medical problems that increase the chance of getting cervical cancer. In these cases, your health care provider may recommend more frequent screening and Pap tests. Colorectal Cancer  This type of cancer can be detected and often prevented.  Routine colorectal cancer screening usually begins at 51 years of age and continues through 51 years of age.  Your health care provider may recommend screening at an earlier age if you have risk factors for colon cancer.  Your health care provider may also recommend using home test kits to check for hidden blood in the stool.  A small camera at the end of a tube can be used to examine your colon directly (sigmoidoscopy or colonoscopy). This is done to check  for the earliest forms of colorectal cancer.  Routine screening usually begins at age 83.  Direct examination of the colon should be repeated every 5-10 years through 51 years of age. However, you may need to be screened more often if early forms of precancerous polyps or small growths are found. Skin Cancer  Check your skin from head to toe regularly.  Tell your health care provider about any new moles or changes in moles, especially if there is a change in a mole's shape or color.  Also tell your health care provider if you have a mole that is larger than the size of a pencil eraser.  Always use sunscreen. Apply sunscreen liberally and repeatedly throughout the day.  Protect yourself by wearing long sleeves, pants, a wide-brimmed hat, and sunglasses whenever you are outside. HEART DISEASE, DIABETES, AND HIGH BLOOD PRESSURE   High blood pressure causes heart disease and increases the risk of stroke. High blood pressure is more likely  to develop in:  People who have blood pressure in the high end of the normal range (130-139/85-89 mm Hg).  People who are overweight or obese.  People who are African American.  If you are 26-82 years of age, have your blood pressure checked every 3-5 years. If you are 33 years of age or older, have your blood pressure checked every year. You should have your blood pressure measured twice--once when you are at a hospital or clinic, and once when you are not at a hospital or clinic. Record the average of the two measurements. To check your blood pressure when you are not at a hospital or clinic, you can use:  An automated blood pressure machine at a pharmacy.  A home blood pressure monitor.  If you are between 69 years and 83 years old, ask your health care provider if you should take aspirin to prevent strokes.  Have regular diabetes screenings. This involves taking a blood sample to check your fasting blood sugar level.  If you are at a normal  weight and have a low risk for diabetes, have this test once every three years after 51 years of age.  If you are overweight and have a high risk for diabetes, consider being tested at a younger age or more often. PREVENTING INFECTION  Hepatitis B  If you have a higher risk for hepatitis B, you should be screened for this virus. You are considered at high risk for hepatitis B if:  You were born in a country where hepatitis B is common. Ask your health care provider which countries are considered high risk.  Your parents were born in a high-risk country, and you have not been immunized against hepatitis B (hepatitis B vaccine).  You have HIV or AIDS.  You use needles to inject street drugs.  You live with someone who has hepatitis B.  You have had sex with someone who has hepatitis B.  You get hemodialysis treatment.  You take certain medicines for conditions, including cancer, organ transplantation, and autoimmune conditions. Hepatitis C  Blood testing is recommended for:  Everyone born from [birth year] through 1966.  Anyone with known risk factors for hepatitis C. Sexually transmitted infections (STIs)  You should be screened for sexually transmitted infections (STIs) including gonorrhea and chlamydia if:  You are sexually active and are younger than 51 years of age.  You are older than 51 years of age and your health care provider tells you that you are at risk for this type of infection.  Your sexual activity has changed since you were last screened and you are at an increased risk for chlamydia or gonorrhea. Ask your health care provider if you are at risk.  If you do not have HIV, but are at risk, it may be recommended that you take a prescription medicine daily to prevent HIV infection. This is called pre-exposure prophylaxis (PrEP). You are considered at risk if:  You are sexually active and do not regularly use condoms or know the HIV status of your partner(s).  You take  drugs by injection.  You are sexually active with a partner who has HIV. Talk with your health care provider about whether you are at high risk of being infected with HIV. If you choose to begin PrEP, you should first be tested for HIV. You should then be tested every 3 months for as long as you are taking PrEP.  PREGNANCY   If you are premenopausal and you may become  pregnant, ask your health care provider about preconception counseling.  If you may become pregnant, take 400 to 800 micrograms (mcg) of folic acid every day.  If you want to prevent pregnancy, talk to your health care provider about birth control (contraception). OSTEOPOROSIS AND MENOPAUSE   Osteoporosis is a disease in which the bones lose minerals and strength with aging. This can result in serious bone fractures. Your risk for osteoporosis can be identified using a bone density scan.  If you are 77 years of age or older, or if you are at risk for osteoporosis and fractures, ask your health care provider if you should be screened.  Ask your health care provider whether you should take a calcium or vitamin D supplement to lower your risk for osteoporosis.  Menopause may have certain physical symptoms and risks.  Hormone replacement therapy may reduce some of these symptoms and risks. Talk to your health care provider about whether hormone replacement therapy is right for you.  HOME CARE INSTRUCTIONS   Schedule regular health, dental, and eye exams.  Stay current with your immunizations.   Do not use any tobacco products including cigarettes, chewing tobacco, or electronic cigarettes.  If you are pregnant, do not drink alcohol.  If you are breastfeeding, limit how much and how often you drink alcohol.  Limit alcohol intake to no more than 1 drink per day for nonpregnant women. One drink equals 12 ounces of beer, 5 ounces of wine, or 1 ounces of hard liquor.  Do not use street drugs.  Do not share  needles.  Ask your health care provider for help if you need support or information about quitting drugs.  Tell your health care provider if you often feel depressed.  Tell your health care provider if you have ever been abused or do not feel safe at home.   This information is not intended to replace advice given to you by your health care provider. Make sure you discuss any questions you have with your health care provider.   Document Released: 04/24/2011 Document Revised: 10/30/2014 Document Reviewed: 09/10/2013 Elsevier Interactive Patient Education [employer].

## 2016-01-26 ENCOUNTER — Other Ambulatory Visit: Payer: Self-pay | Admitting: Women's Health

## 2016-01-26 DIAGNOSIS — E039 Hypothyroidism, unspecified: Secondary | ICD-10-CM

## 2016-01-26 MED ORDER — LEVOTHYROXINE SODIUM 75 MCG PO TABS: 75.0000 ug | ORAL_TABLET | Freq: Every day | ORAL | Status: DC

## 2016-01-27 LAB — PAP, TP IMAGING W/ HPV RNA, RFLX HPV TYPE 16,18/45: HPV mRNA, High Risk: NOT DETECTED

## 2016-01-31 ENCOUNTER — Telehealth: Payer: Self-pay | Admitting: *Deleted

## 2016-01-31 ENCOUNTER — Encounter: Payer: Self-pay | Admitting: Internal Medicine

## 2016-01-31 NOTE — Telephone Encounter (Signed)
Pt called requesting name and # of mammogram office, i left message on pt voicemail the [facility] [place] and [facility].

## 2016-02-01 ENCOUNTER — Other Ambulatory Visit: Payer: Self-pay

## 2016-02-01 DIAGNOSIS — Z1231 Encounter for screening mammogram for malignant neoplasm of breast: Secondary | ICD-10-CM

## 2016-02-22 ENCOUNTER — Ambulatory Visit
Admission: RE | Admit: 2016-02-22 | Discharge: 2016-02-22 | Disposition: A | Discharge status: Home / Self Care | Payer: BLUE CROSS/BLUE SHIELD | Source: Ambulatory Visit

## 2016-02-22 DIAGNOSIS — Z1231 Encounter for screening mammogram for malignant neoplasm of breast: Secondary | ICD-10-CM

## 2016-03-07 ENCOUNTER — Other Ambulatory Visit: Payer: BLUE CROSS/BLUE SHIELD

## 2016-03-07 DIAGNOSIS — E039 Hypothyroidism, unspecified: Secondary | ICD-10-CM

## 2016-03-07 LAB — TSH: TSH: 1.4 mIU/L

## 2016-03-15 ENCOUNTER — Other Ambulatory Visit: Payer: Self-pay | Admitting: Women's Health

## 2016-03-15 MED ORDER — LEVOTHYROXINE SODIUM 75 MCG PO TABS: 75.0000 ug | ORAL_TABLET | Freq: Every day | ORAL | Status: DC

## 2016-03-28 ENCOUNTER — Encounter: Payer: Self-pay | Admitting: Internal Medicine

## 2016-07-18 ENCOUNTER — Ambulatory Visit (AMBULATORY_SURGERY_CENTER): Payer: Self-pay | Admitting: *Deleted

## 2016-07-18 VITALS — Ht 62.0 in | Wt 162.0 lb

## 2016-07-18 DIAGNOSIS — Z1211 Encounter for screening for malignant neoplasm of colon: Secondary | ICD-10-CM

## 2016-07-18 MED ORDER — NA SULFATE-K SULFATE-MG SULF 17.5-3.13-1.6 GM/177ML PO SOLN: 1.0000 | Freq: Once | ORAL | 0 refills | Status: AC

## 2016-07-18 NOTE — Progress Notes (Signed)
No egg or soy allergy known to patient  No issues with past sedation with any surgeries  or procedures, no intubation problems  No diet pills per patient No home 02 use per patient  No blood thinners per patient  Pt denies issues with constipation  No A fib or A flutter  emmi video to e mail    

## 2016-07-25 ENCOUNTER — Encounter: Payer: Self-pay | Admitting: Internal Medicine

## 2016-08-03 ENCOUNTER — Encounter: Payer: Self-pay | Admitting: Internal Medicine

## 2016-08-03 ENCOUNTER — Ambulatory Visit (AMBULATORY_SURGERY_CENTER): Payer: BLUE CROSS/BLUE SHIELD | Admitting: Internal Medicine

## 2016-08-03 VITALS — BP 109/71 | HR 67 | Temp 98.9°F | Resp 28 | Ht 62.0 in | Wt 162.0 lb

## 2016-08-03 DIAGNOSIS — D125 Benign neoplasm of sigmoid colon: Secondary | ICD-10-CM

## 2016-08-03 DIAGNOSIS — K635 Polyp of colon: Secondary | ICD-10-CM | POA: Diagnosis not present

## 2016-08-03 DIAGNOSIS — Z1212 Encounter for screening for malignant neoplasm of rectum: Secondary | ICD-10-CM | POA: Diagnosis not present

## 2016-08-03 DIAGNOSIS — Z1211 Encounter for screening for malignant neoplasm of colon: Secondary | ICD-10-CM | POA: Diagnosis not present

## 2016-08-03 MED ORDER — SODIUM CHLORIDE 0.9 % IV SOLN: 500.0000 mL | INTRAVENOUS | Status: DC

## 2016-08-03 NOTE — Patient Instructions (Signed)
YOU HAD AN ENDOSCOPIC PROCEDURE TODAY AT THE [facility]:   Refer to the procedure report that was given to you for any specific questions about what was found during the examination.  If the procedure report does not answer your questions, please call your gastroenterologist to clarify.  If you requested that your care partner not be given the details of your procedure findings, then the procedure report has been included in a sealed envelope for you to review at your convenience later.  YOU SHOULD EXPECT: Some feelings of bloating in the abdomen. Passage of more gas than usual.  Walking can help get rid of the air that was put into your GI tract during the procedure and reduce the bloating. If you had a lower endoscopy (such as a colonoscopy or flexible sigmoidoscopy) you may notice spotting of blood in your stool or on the toilet paper. If you underwent a bowel prep for your procedure, you may not have a normal bowel movement for a few days.  Please Note:  You might notice some irritation and congestion in your nose or some drainage.  This is from the oxygen used during your procedure.  There is no need for concern and it should clear up in a day or so.  SYMPTOMS TO REPORT IMMEDIATELY:   Following lower endoscopy (colonoscopy or flexible sigmoidoscopy):  Excessive amounts of blood in the stool  Significant tenderness or worsening of abdominal pains  Swelling of the abdomen that is new, acute  Fever of 100F or higher   For urgent or emergent issues, a gastroenterologist can be reached at any hour by calling [phone].   DIET:  We do recommend a small meal at first, but then you may proceed to your regular diet.  Drink plenty of fluids but you should avoid alcoholic beverages for 24 hours. Try to increase the fiber in your diet, and drink plenty of water.  ACTIVITY:  You should plan to take it easy for the rest of today and you should NOT DRIVE or use heavy machinery until  tomorrow (because of the sedation medicines used during the test).    FOLLOW UP: Our staff will call the number listed on your records the next business day following your procedure to check on you and address any questions or concerns that you may have regarding the information given to you following your procedure. If we do not reach you, we will leave a message.  However, if you are feeling well and you are not experiencing any problems, there is no need to return our call.  We will assume that you have returned to your regular daily activities without incident.  If any biopsies were taken you will be contacted by phone or by letter within the next 1-3 weeks.  Please call us at [phone] if you have not heard about the biopsies in 3 weeks.    SIGNATURES/CONFIDENTIALITY: You and/or your care partner have signed paperwork which will be entered into your electronic medical record.  These signatures attest to the fact that that the information above on your After Visit Summary has been reviewed and is understood.  Full responsibility of the confidentiality of this discharge information lies with you and/or your care-partner.  Read all of the handouts given to you by your recovery room nurse.  Thank-you for choosing us for your healthcare needs today. 

## 2016-08-03 NOTE — Op Note (Signed)
Friend Patient Name: Kimberly Hill Procedure Date: 08/03/2016 10:20 AM MRN: [id number] Endoscopist: Jerene Bears , MD Age: 50 Referring MD:  Date of Birth: [birth year] Gender: Female Account #: [id number] Procedure:                Colonoscopy Indications:              Screening for colorectal malignant neoplasm, This                            is the patient's first colonoscopy Medicines:                Monitored Anesthesia Care Procedure:                Pre-Anesthesia Assessment:                           - Prior to the procedure, a History and Physical                            was performed, and patient medications and                            allergies were reviewed. The patient's tolerance of                            previous anesthesia was also reviewed. The risks                            and benefits of the procedure and the sedation                            options and risks were discussed with the patient.                            All questions were answered, and informed consent                            was obtained. Prior Anticoagulants: The patient has                            taken no previous anticoagulant or antiplatelet                            agents. ASA Grade Assessment: II - A patient with                            mild systemic disease. After reviewing the risks                            and benefits, the patient was deemed in                            satisfactory condition to undergo the procedure.  After obtaining informed consent, the colonoscope                            was passed under direct vision. Throughout the                            procedure, the patient's blood pressure, pulse, and                            oxygen saturations were monitored continuously. The                            Model PCF-[id number]) scope was introduced                            through the anus and  advanced to the the cecum,                            identified by appendiceal orifice and ileocecal                            valve. The colonoscopy was performed without                            difficulty. The patient tolerated the procedure                            well. The quality of the bowel preparation was                            good. The ileocecal valve, appendiceal orifice, and                            rectum were photographed. Scope In: 10:35:48 AM Scope Out: 10:48:27 AM Scope Withdrawal Time: 0 hours 8 minutes 48 seconds  Total Procedure Duration: 0 hours 12 minutes 39 seconds  Findings:                 The digital rectal exam was normal.                           A 5 mm polyp was found in the sigmoid colon. The                            polyp was sessile. The polyp was removed with a                            cold snare. Resection and retrieval were complete.                           A few small-mouthed diverticula were found in the                            sigmoid colon.  Anal papilla(e) were hypertrophied. Complications:            No immediate complications. Estimated Blood Loss:     Estimated blood loss was minimal. Impression:               - One 5 mm polyp in the sigmoid colon, removed with                            a cold snare. Resected and retrieved.                           - Diverticulosis in the sigmoid colon.                           - Anal papillae were hypertrophied on retroflexed                            views in the rectum. Recommendation:           - Patient has a contact number available for                            emergencies. The signs and symptoms of potential                            delayed complications were discussed with the                            patient. Return to normal activities tomorrow.                            Written discharge instructions were provided to the                             patient.                           - Resume previous diet.                           - Continue present medications.                           - Await pathology results.                           - Repeat colonoscopy is recommended. The                            colonoscopy date will be determined after pathology                            results from today's exam become available for                            review. Jerene Bears, MD 08/03/2016 10:52:35 AM This report has been signed electronically.

## 2016-08-03 NOTE — Progress Notes (Signed)
Called to room to assist during endoscopic procedure.  Patient ID and intended procedure confirmed with present staff. Received instructions for my participation in the procedure from the performing physician.  

## 2016-08-03 NOTE — Progress Notes (Signed)
Report to PACU, RN, vss, BBS= Clear.  

## 2016-08-04 ENCOUNTER — Telehealth: Payer: Self-pay | Admitting: *Deleted

## 2016-08-04 ENCOUNTER — Telehealth: Payer: Self-pay

## 2016-08-04 NOTE — Telephone Encounter (Signed)
  Follow up Call-  Call back number 08/03/2016  Post procedure Call Back phone  # [phone]  Permission to leave phone message Yes  Some recent data might be hidden     Patient questions:  Do you have a fever, pain , or abdominal swelling? No. Pain Score  0 *  Have you tolerated food without any problems? Yes.    Have you been able to return to your normal activities? Yes.    Do you have any questions about your discharge instructions: Diet   No. Medications  No. Follow up visit  No.  Do you have questions or concerns about your Care? No.  Actions: * If pain score is 4 or above: No action needed, pain <4.

## 2016-08-04 NOTE — Telephone Encounter (Signed)
  Follow up Call-  Call back number 08/03/2016  Post procedure Call Back phone  # [phone]  Permission to leave phone message Yes  Some recent data might be hidden     Patient questions:   Message left to call us if necessary.

## 2016-08-08 ENCOUNTER — Encounter: Payer: Self-pay | Admitting: Internal Medicine

## 2017-01-09 ENCOUNTER — Other Ambulatory Visit: Payer: Self-pay | Admitting: Women's Health

## 2017-01-09 DIAGNOSIS — Z1231 Encounter for screening mammogram for malignant neoplasm of breast: Secondary | ICD-10-CM

## 2017-02-07 ENCOUNTER — Encounter: Payer: BLUE CROSS/BLUE SHIELD | Admitting: Women's Health

## 2017-02-27 ENCOUNTER — Ambulatory Visit
Admission: RE | Admit: 2017-02-27 | Discharge: 2017-02-27 | Disposition: A | Discharge status: Home / Self Care | Payer: BLUE CROSS/BLUE SHIELD | Source: Ambulatory Visit | Attending: Women's Health | Admitting: Women's Health

## 2017-02-27 ENCOUNTER — Encounter: Payer: Self-pay | Admitting: Women's Health

## 2017-02-27 DIAGNOSIS — Z1231 Encounter for screening mammogram for malignant neoplasm of breast: Secondary | ICD-10-CM

## 2017-03-07 ENCOUNTER — Encounter: Payer: Self-pay | Admitting: Gynecology

## 2017-03-12 ENCOUNTER — Ambulatory Visit (INDEPENDENT_AMBULATORY_CARE_PROVIDER_SITE_OTHER): Payer: BLUE CROSS/BLUE SHIELD | Admitting: Women's Health

## 2017-03-12 ENCOUNTER — Encounter: Payer: Self-pay | Admitting: Women's Health

## 2017-03-12 VITALS — BP 120/84 | Ht 62.0 in | Wt 171.0 lb

## 2017-03-12 DIAGNOSIS — E039 Hypothyroidism, unspecified: Secondary | ICD-10-CM

## 2017-03-12 DIAGNOSIS — Z13220 Encounter for screening for lipoid disorders: Secondary | ICD-10-CM

## 2017-03-12 DIAGNOSIS — Z01419 Encounter for gynecological examination (general) (routine) without abnormal findings: Secondary | ICD-10-CM

## 2017-03-12 LAB — COMPREHENSIVE METABOLIC PANEL
ALT: 19 U/L (ref 6–29)
AST: 19 U/L (ref 10–35)
Albumin: 4.1 g/dL (ref 3.6–5.1)
Alkaline Phosphatase: 54 U/L (ref 33–130)
BILIRUBIN TOTAL: 0.4 mg/dL (ref 0.2–1.2)
BUN: 12 mg/dL (ref 7–25)
CHLORIDE: 105 mmol/L (ref 98–110)
CO2: 25 mmol/L (ref 20–31)
CREATININE: 0.9 mg/dL (ref 0.50–1.05)
Calcium: 9.5 mg/dL (ref 8.6–10.4)
Glucose, Bld: 95 mg/dL (ref 65–99)
Potassium: 4.4 mmol/L (ref 3.5–5.3)
SODIUM: 138 mmol/L (ref 135–146)
TOTAL PROTEIN: 6.5 g/dL (ref 6.1–8.1)

## 2017-03-12 LAB — LIPID PANEL
CHOLESTEROL: 220 mg/dL — AB (ref <200)
HDL: 47 mg/dL — ABNORMAL LOW (ref >50)
LDL Cholesterol: 148 mg/dL — ABNORMAL HIGH (ref <100)
Total CHOL/HDL Ratio: 4.7 Ratio (ref <5.0)
Triglycerides: 127 mg/dL (ref <150)
VLDL: 25 mg/dL (ref <30)

## 2017-03-12 LAB — CBC WITH DIFFERENTIAL/PLATELET
BASOS ABS: 0 {cells}/uL (ref 0–200)
Basophils Relative: 0 %
EOS ABS: 134 {cells}/uL (ref 15–500)
EOS PCT: 2 %
HCT: 41.1 % (ref 35.0–45.0)
HEMOGLOBIN: 13.4 g/dL (ref 11.7–15.5)
LYMPHS ABS: 1474 {cells}/uL (ref 850–3900)
Lymphocytes Relative: 22 %
MCH: 29.5 pg (ref 27.0–33.0)
MCHC: 32.6 g/dL (ref 32.0–36.0)
MCV: 90.5 fL (ref 80.0–100.0)
MONO ABS: 536 {cells}/uL (ref 200–950)
MPV: 9.8 fL (ref 7.5–12.5)
Monocytes Relative: 8 %
NEUTROS PCT: 68 %
Neutro Abs: 4556 cells/uL (ref 1500–7800)
PLATELETS: 232 10*3/uL (ref 140–400)
RBC: 4.54 MIL/uL (ref 3.80–5.10)
RDW: 14.3 % (ref 11.0–15.0)
WBC: 6.7 10*3/uL (ref 3.8–10.8)

## 2017-03-12 LAB — TSH: TSH: 6.71 mIU/L — ABNORMAL HIGH

## 2017-03-12 MED ORDER — LEVOTHYROXINE SODIUM 75 MCG PO TABS: 75.0000 ug | ORAL_TABLET | Freq: Every day | ORAL | 4 refills | Status: DC

## 2017-03-12 NOTE — Patient Instructions (Signed)
Health Maintenance, Female Adopting a healthy lifestyle and getting preventive care can go a long way to promote health and wellness. Talk with your health care provider about what schedule of regular examinations is right for you. This is a good chance for you to check in with your provider about disease prevention and staying healthy. In between checkups, there are plenty of things you can do on your own. Experts have done a lot of research about which lifestyle changes and preventive measures are most likely to keep you healthy. Ask your health care provider for more information. Weight and diet Eat a healthy diet  Be sure to include plenty of vegetables, fruits, low-fat dairy products, and lean protein.  Do not eat a lot of foods high in solid fats, added sugars, or salt.  Get regular exercise. This is one of the most important things you can do for your health.  Most adults should exercise for at least 150 minutes each week. The exercise should increase your heart rate and make you sweat (moderate-intensity exercise).  Most adults should also do strengthening exercises at least twice a week. This is in addition to the moderate-intensity exercise. Maintain a healthy weight  Body mass index (BMI) is a measurement that can be used to identify possible weight problems. It estimates body fat based on height and weight. Your health care provider can help determine your BMI and help you achieve or maintain a healthy weight.  For females 52 years of age and older:  A BMI below 18.5 is considered underweight.  A BMI of 18.5 to 24.9 is normal.  A BMI of 25 to 29.9 is considered overweight.  A BMI of 30 and above is considered obese. Watch levels of cholesterol and blood lipids  You should start having your blood tested for lipids and cholesterol at 52 years of age, then have this test every 5 years.  You may need to have your cholesterol levels checked more often if:  Your lipid or  cholesterol levels are high.  You are older than 52 years of age.  You are at high risk for heart disease. Cancer screening Lung Cancer  Lung cancer screening is recommended for adults 52-52 years old who are at high risk for lung cancer because of a history of smoking.  A yearly low-dose CT scan of the lungs is recommended for people who: 52  Currently smoke.  Have quit within the past 15 years.  Have at least a 30-pack-year history of smoking. A pack year is smoking an average of one pack of cigarettes a day for 1 year.  Yearly screening should continue until it has been 15 years since you quit.  Yearly screening should stop if you develop a health problem that would prevent you from having lung cancer treatment. Breast Cancer  Practice breast self-awareness. This means understanding how your breasts normally appear and feel.  It also means doing regular breast self-exams. Let your health care provider know about any changes, no matter how small.  If you are in your 20s or 30s, you should have a clinical breast exam (CBE) by a health care provider every 1-3 years as part of a regular health exam.  If you are 34 or older, have a CBE every year. Also consider having a breast X-ray (mammogram) every year.  If you have a family history of breast cancer, talk to your health care provider about genetic screening.  If you are at high risk for breast cancer, talk  to your health care provider about having an MRI and a mammogram every year.  Breast cancer gene (BRCA) assessment is recommended for women who have family members with BRCA-related cancers. BRCA-related cancers include:  Breast.  Ovarian.  Tubal.  Peritoneal cancers.  Results of the assessment will determine the need for genetic counseling and BRCA1 and BRCA2 testing. Cervical Cancer  Your health care provider may recommend that you be screened regularly for cancer of the pelvic organs (ovaries, uterus, and vagina).  This screening involves a pelvic examination, including checking for microscopic changes to the surface of your cervix (Pap test). You may be encouraged to have this screening done every 3 years, beginning at age 24.  For women ages 66-65, health care providers may recommend pelvic exams and Pap testing every 3 years, or they may recommend the Pap and pelvic exam, combined with testing for human papilloma virus (HPV), every 5 years. Some types of HPV increase your risk of cervical cancer. Testing for HPV may also be done on women of any age with unclear Pap test results.  Other health care providers may not recommend any screening for nonpregnant women who are considered low risk for pelvic cancer and who do not have symptoms. Ask your health care provider if a screening pelvic exam is right for you.  If you have had past treatment for cervical cancer or a condition that could lead to cancer, you need Pap tests and screening for cancer for at least 20 years after your treatment. If Pap tests have been discontinued, your risk factors (such as having a new sexual partner) need to be reassessed to determine if screening should resume. Some women have medical problems that increase the chance of getting cervical cancer. In these cases, your health care provider may recommend more frequent screening and Pap tests. Colorectal Cancer  This type of cancer can be detected and often prevented.  Routine colorectal cancer screening usually begins at 52 years of age and continues through 52 years of age.  Your health care provider may recommend screening at an earlier age if you have risk factors for colon cancer.  Your health care provider may also recommend using home test kits to check for hidden blood in the stool.  A small camera at the end of a tube can be used to examine your colon directly (sigmoidoscopy or colonoscopy). This is done to check for the earliest forms of colorectal cancer.  Routine  screening usually begins at age 41.  Direct examination of the colon should be repeated every 5-10 years through 52 years of age. However, you may need to be screened more often if early forms of precancerous polyps or small growths are found. Skin Cancer  Check your skin from head to toe regularly.  Tell your health care provider about any new moles or changes in moles, especially if there is a change in a mole's shape or color.  Also tell your health care provider if you have a mole that is larger than the size of a pencil eraser.  Always use sunscreen. Apply sunscreen liberally and repeatedly throughout the day.  Protect yourself by wearing long sleeves, pants, a wide-brimmed hat, and sunglasses whenever you are outside. Heart disease, diabetes, and high blood pressure  High blood pressure causes heart disease and increases the risk of stroke. High blood pressure is more likely to develop in:  People who have blood pressure in the high end of the normal range (130-139/85-89 mm Hg).  People who are overweight or obese.  People who are African American.  If you are 59-24 years of age, have your blood pressure checked every 3-5 years. If you are 34 years of age or older, have your blood pressure checked every year. You should have your blood pressure measured twice-once when you are at a hospital or clinic, and once when you are not at a hospital or clinic. Record the average of the two measurements. To check your blood pressure when you are not at a hospital or clinic, you can use:  An automated blood pressure machine at a pharmacy.  A home blood pressure monitor.  If you are between 29 years and 60 years old, ask your health care provider if you should take aspirin to prevent strokes.  Have regular diabetes screenings. This involves taking a blood sample to check your fasting blood sugar level.  If you are at a normal weight and have a low risk for diabetes, have this test once  every three years after 52 years of age.  If you are overweight and have a high risk for diabetes, consider being tested at a younger age or more often. Preventing infection Hepatitis B  If you have a higher risk for hepatitis B, you should be screened for this virus. You are considered at high risk for hepatitis B if:  You were born in a country where hepatitis B is common. Ask your health care provider which countries are considered high risk.  Your parents were born in a high-risk country, and you have not been immunized against hepatitis B (hepatitis B vaccine).  You have HIV or AIDS.  You use needles to inject street drugs.  You live with someone who has hepatitis B.  You have had sex with someone who has hepatitis B.  You get hemodialysis treatment.  You take certain medicines for conditions, including cancer, organ transplantation, and autoimmune conditions. Hepatitis C  Blood testing is recommended for:  Everyone born from [birth year] through 1966.  Anyone with known risk factors for hepatitis C. Sexually transmitted infections (STIs)  You should be screened for sexually transmitted infections (STIs) including gonorrhea and chlamydia if:  You are sexually active and are younger than 52 years of age.  You are older than 52 years of age and your health care provider tells you that you are at risk for this type of infection.  Your sexual activity has changed since you were last screened and you are at an increased risk for chlamydia or gonorrhea. Ask your health care provider if you are at risk.  If you do not have HIV, but are at risk, it may be recommended that you take a prescription medicine daily to prevent HIV infection. This is called pre-exposure prophylaxis (PrEP). You are considered at risk if:  You are sexually active and do not regularly use condoms or know the HIV status of your partner(s).  You take drugs by injection.  You are sexually active with a partner  who has HIV. Talk with your health care provider about whether you are at high risk of being infected with HIV. If you choose to begin PrEP, you should first be tested for HIV. You should then be tested every 3 months for as long as you are taking PrEP. Pregnancy  If you are premenopausal and you may become pregnant, ask your health care provider about preconception counseling.  If you may become pregnant, take 400 to 800 micrograms (mcg) of folic acid  every day.  If you want to prevent pregnancy, talk to your health care provider about birth control (contraception). Osteoporosis and menopause  Osteoporosis is a disease in which the bones lose minerals and strength with aging. This can result in serious bone fractures. Your risk for osteoporosis can be identified using a bone density scan.  If you are 4 years of age or older, or if you are at risk for osteoporosis and fractures, ask your health care provider if you should be screened.  Ask your health care provider whether you should take a calcium or vitamin D supplement to lower your risk for osteoporosis.  Menopause may have certain physical symptoms and risks.  Hormone replacement therapy may reduce some of these symptoms and risks. Talk to your health care provider about whether hormone replacement therapy is right for you. Follow these instructions at home:  Schedule regular health, dental, and eye exams.  Stay current with your immunizations.  Do not use any tobacco products including cigarettes, chewing tobacco, or electronic cigarettes.  If you are pregnant, do not drink alcohol.  If you are breastfeeding, limit how much and how often you drink alcohol.  Limit alcohol intake to no more than 1 drink per day for nonpregnant women. One drink equals 12 ounces of beer, 5 ounces of wine, or 1 ounces of hard liquor.  Do not use street drugs.  Do not share needles.  Ask your health care provider for help if you need support  or information about quitting drugs.  Tell your health care provider if you often feel depressed.  Tell your health care provider if you have ever been abused or do not feel safe at home. This information is not intended to replace advice given to you by your health care provider. Make sure you discuss any questions you have with your health care provider. Document Released: 04/24/2011 Document Revised: 03/16/2016 Document Reviewed: 07/13/2015 Elsevier Interactive Patient Education  2017 [employer].

## 2017-03-12 NOTE — Progress Notes (Signed)
Kimberly Hill Nov 22, 1965 [id number]    History:    Presents for annual exam.  Monthly cycle using no contraception. Normal Pap and mammogram history. Hypothyroid on Synthroid 75 g. History of a negative thyroid biopsy. 07/2016 benign colon polyp. Deaf but reads lips and able to communicate.  Past medical history, past surgical history, family history and social history were all reviewed and documented in the EPIC chart. Works at [employer]. Son 10 in college doing well, daughter 44 doing well. Father hypertension.  ROS:  A ROS was performed and pertinent positives and negatives are included.  Exam:  Vitals:   03/12/17 0831  BP: 120/84  Weight: 171 lb (77.6 kg)  Height: 5\' 2"  (1.575 m)   Body mass index is 31.28 kg/m.   General appearance:  Normal Thyroid:  Symmetrical, normal in size, without palpable masses or nodularity. Respiratory  Auscultation:  Clear without wheezing or rhonchi Cardiovascular  Auscultation:  Regular rate, without rubs, murmurs or gallops  Edema/varicosities:  Not grossly evident Abdominal  Soft,nontender, without masses, guarding or rebound.  Liver/spleen:  No organomegaly noted  Hernia:  None appreciated  Skin  Inspection:  Grossly normal   Breasts: Examined lying and sitting.     Right: Without masses, retractions, discharge or axillary adenopathy.     Left: Without masses, retractions, discharge or axillary adenopathy. Gentitourinary   Inguinal/mons:  Normal without inguinal adenopathy  External genitalia:  Normal  BUS/Urethra/Skene's glands:  Normal  Vagina:  Normal  Cervix:  Normal  Uterus:   normal in size, shape and contour.  Midline and mobile  Adnexa/parametria:     Rt: Without masses or tenderness.   Lt: Without masses or tenderness.  Anus and perineum: Normal  Digital rectal exam: Normal sphincter tone without palpated masses or tenderness  Assessment/Plan:  51 y.o. MWF G2 P2  for annual exam with no complaints.  Legally deaf-reads  lips Monthly cycle Hypothyroid  Plan: Contraception reviewed and declined states that she is no contraception for 15 years without a problem. Menopause reviewed, denies symptoms. SBE's, continue annual 3-D screening mammogram history of dense breasts. Exercise, calcium rich diet, vitamin D 1000 daily encouraged. Synthroid 75 g by mouth daily proper administration reviewed. CBC, lipid panel, CMP, TSH. Normal Pap with negative HR HPV 2017, new screening guidelines reviewed.  Johnson, 9:44 AM 03/12/2017

## 2017-03-14 ENCOUNTER — Other Ambulatory Visit: Payer: Self-pay | Admitting: Women's Health

## 2017-03-14 DIAGNOSIS — R7989 Other specified abnormal findings of blood chemistry: Secondary | ICD-10-CM

## 2017-03-14 DIAGNOSIS — E7800 Pure hypercholesterolemia, unspecified: Secondary | ICD-10-CM

## 2017-03-14 DIAGNOSIS — E038 Other specified hypothyroidism: Secondary | ICD-10-CM

## 2017-03-14 MED ORDER — LEVOTHYROXINE SODIUM 88 MCG PO TABS: 88.0000 ug | ORAL_TABLET | Freq: Every day | ORAL | 2 refills | Status: DC

## 2017-05-15 ENCOUNTER — Other Ambulatory Visit: Payer: BLUE CROSS/BLUE SHIELD

## 2017-05-15 DIAGNOSIS — E7800 Pure hypercholesterolemia, unspecified: Secondary | ICD-10-CM | POA: Diagnosis not present

## 2017-05-15 DIAGNOSIS — E038 Other specified hypothyroidism: Secondary | ICD-10-CM

## 2017-05-15 LAB — LIPID PANEL
CHOLESTEROL: 191 mg/dL (ref <200)
HDL: 51 mg/dL (ref >50)
LDL Cholesterol: 112 mg/dL — ABNORMAL HIGH (ref <100)
TRIGLYCERIDES: 140 mg/dL (ref <150)
Total CHOL/HDL Ratio: 3.7 Ratio (ref <5.0)
VLDL: 28 mg/dL (ref <30)

## 2017-05-15 LAB — TSH: TSH: 3.92 m[IU]/L

## 2017-05-23 DIAGNOSIS — F4322 Adjustment disorder with anxiety: Secondary | ICD-10-CM | POA: Diagnosis not present

## 2017-06-17 ENCOUNTER — Other Ambulatory Visit: Payer: Self-pay | Admitting: Women's Health

## 2017-08-22 DIAGNOSIS — E039 Hypothyroidism, unspecified: Secondary | ICD-10-CM | POA: Diagnosis not present

## 2017-11-29 DIAGNOSIS — E039 Hypothyroidism, unspecified: Secondary | ICD-10-CM | POA: Diagnosis not present

## 2018-01-09 DIAGNOSIS — D485 Neoplasm of uncertain behavior of skin: Secondary | ICD-10-CM | POA: Diagnosis not present

## 2018-01-09 DIAGNOSIS — L821 Other seborrheic keratosis: Secondary | ICD-10-CM | POA: Diagnosis not present

## 2018-01-09 DIAGNOSIS — D0339 Melanoma in situ of other parts of face: Secondary | ICD-10-CM | POA: Diagnosis not present

## 2018-02-26 DIAGNOSIS — D0339 Melanoma in situ of other parts of face: Secondary | ICD-10-CM | POA: Diagnosis not present

## 2018-02-26 DIAGNOSIS — L989 Disorder of the skin and subcutaneous tissue, unspecified: Secondary | ICD-10-CM | POA: Diagnosis not present

## 2018-02-26 DIAGNOSIS — L905 Scar conditions and fibrosis of skin: Secondary | ICD-10-CM | POA: Diagnosis not present

## 2018-03-01 DIAGNOSIS — E039 Hypothyroidism, unspecified: Secondary | ICD-10-CM | POA: Diagnosis not present

## 2018-03-05 ENCOUNTER — Other Ambulatory Visit: Payer: Self-pay | Admitting: Women's Health

## 2018-03-05 DIAGNOSIS — Z1231 Encounter for screening mammogram for malignant neoplasm of breast: Secondary | ICD-10-CM

## 2018-03-15 ENCOUNTER — Ambulatory Visit
Admission: RE | Admit: 2018-03-15 | Discharge: 2018-03-15 | Disposition: A | Discharge status: Home / Self Care | Payer: BLUE CROSS/BLUE SHIELD | Source: Ambulatory Visit | Attending: Women's Health | Admitting: Women's Health

## 2018-03-15 ENCOUNTER — Encounter (INDEPENDENT_AMBULATORY_CARE_PROVIDER_SITE_OTHER): Payer: Self-pay

## 2018-03-15 DIAGNOSIS — Z1231 Encounter for screening mammogram for malignant neoplasm of breast: Secondary | ICD-10-CM | POA: Diagnosis not present

## 2018-03-20 ENCOUNTER — Encounter: Payer: Self-pay | Admitting: Women's Health

## 2018-03-20 ENCOUNTER — Ambulatory Visit (INDEPENDENT_AMBULATORY_CARE_PROVIDER_SITE_OTHER): Payer: BLUE CROSS/BLUE SHIELD | Admitting: Women's Health

## 2018-03-20 VITALS — BP 124/80 | Ht 62.0 in | Wt 167.0 lb

## 2018-03-20 DIAGNOSIS — Z01419 Encounter for gynecological examination (general) (routine) without abnormal findings: Secondary | ICD-10-CM

## 2018-03-20 DIAGNOSIS — Z13220 Encounter for screening for lipoid disorders: Secondary | ICD-10-CM

## 2018-03-20 DIAGNOSIS — E038 Other specified hypothyroidism: Secondary | ICD-10-CM

## 2018-03-20 LAB — COMPREHENSIVE METABOLIC PANEL
AG Ratio: 1.7 (calc) (ref 1.0–2.5)
ALT: 16 U/L (ref 6–29)
AST: 18 U/L (ref 10–35)
Albumin: 4 g/dL (ref 3.6–5.1)
Alkaline phosphatase (APISO): 59 U/L (ref 33–130)
BUN: 11 mg/dL (ref 7–25)
CO2: 26 mmol/L (ref 20–32)
CREATININE: 0.84 mg/dL (ref 0.50–1.05)
Calcium: 9.5 mg/dL (ref 8.6–10.4)
Chloride: 104 mmol/L (ref 98–110)
GLOBULIN: 2.4 g/dL (ref 1.9–3.7)
GLUCOSE: 87 mg/dL (ref 65–99)
Potassium: 4.2 mmol/L (ref 3.5–5.3)
SODIUM: 139 mmol/L (ref 135–146)
TOTAL PROTEIN: 6.4 g/dL (ref 6.1–8.1)
Total Bilirubin: 0.4 mg/dL (ref 0.2–1.2)

## 2018-03-20 LAB — LIPID PANEL
CHOL/HDL RATIO: 4.1 (calc) (ref <5.0)
Cholesterol: 199 mg/dL (ref <200)
HDL: 49 mg/dL — AB (ref >50)
LDL Cholesterol (Calc): 123 mg/dL (calc) — ABNORMAL HIGH
NON-HDL CHOLESTEROL (CALC): 150 mg/dL — AB (ref <130)
TRIGLYCERIDES: 154 mg/dL — AB (ref <150)

## 2018-03-20 LAB — CBC WITH DIFFERENTIAL/PLATELET
Basophils Absolute: 18 cells/uL (ref 0–200)
Basophils Relative: 0.3 %
EOS ABS: 89 {cells}/uL (ref 15–500)
EOS PCT: 1.5 %
HCT: 39.4 % (ref 35.0–45.0)
HEMOGLOBIN: 13.4 g/dL (ref 11.7–15.5)
Lymphs Abs: 1298 cells/uL (ref 850–3900)
MCH: 30.2 pg (ref 27.0–33.0)
MCHC: 34 g/dL (ref 32.0–36.0)
MCV: 88.7 fL (ref 80.0–100.0)
MONOS PCT: 9.1 %
MPV: 10.5 fL (ref 7.5–12.5)
NEUTROS ABS: 3959 {cells}/uL (ref 1500–7800)
Neutrophils Relative %: 67.1 %
PLATELETS: 205 10*3/uL (ref 140–400)
RBC: 4.44 10*6/uL (ref 3.80–5.10)
RDW: 12.6 % (ref 11.0–15.0)
Total Lymphocyte: 22 %
WBC mixed population: 537 cells/uL (ref 200–950)
WBC: 5.9 10*3/uL (ref 3.8–10.8)

## 2018-03-20 LAB — TSH: TSH: 1.83 m[IU]/L

## 2018-03-20 MED ORDER — LEVOTHYROXINE SODIUM 88 MCG PO TABS: ORAL_TABLET | ORAL | 12 refills | Status: DC

## 2018-03-20 NOTE — Patient Instructions (Signed)

## 2018-03-20 NOTE — Progress Notes (Signed)
Kimberly Hill 1966-09-22 [id number]    History:    Presents for annual exam.  Cycles are becoming more irregular every 1 to 3 months, no spotting between cycles.  Normal Pap and mammogram history.  Hypothyroid.  2017 benign colon polyp 10-year follow-up.  Hearing loss does read lips.  Able to work full-time job at [employer].  Past medical history, past surgical history, family history and social history were all reviewed and documented in the EPIC chart.  Exercising on a regular basis.  Son 21 junior at [employer] in [place], daughter 17, senior in high school planning to go to college in [place] for Community education officer.  ROS:  A ROS was performed and pertinent positives and negatives are included.  Exam:  Vitals:   03/20/18 0859  BP: 124/80  Weight: 167 lb (75.8 kg)  Height: 5\' 2"  (1.575 m)   Body mass index is 30.54 kg/m.   General appearance:  Normal Thyroid:  Symmetrical, normal in size, without palpable masses or nodularity. Respiratory  Auscultation:  Clear without wheezing or rhonchi Cardiovascular  Auscultation:  Regular rate, without rubs, murmurs or gallops  Edema/varicosities:  Not grossly evident Abdominal  Soft,nontender, without masses, guarding or rebound.  Liver/spleen:  No organomegaly noted  Hernia:  None appreciated  Skin  Inspection:  Grossly normal   Breasts: Examined lying and sitting.     Right: Without masses, retractions, discharge or axillary adenopathy.     Left: Without masses, retractions, discharge or axillary adenopathy. Gentitourinary   Inguinal/mons:  Normal without inguinal adenopathy  External genitalia:  Normal  BUS/Urethra/Skene's glands:  Normal  Vagina:  Normal  Cervix:  Normal  Uterus: normal in size, shape and contour.  Midline and mobile  Adnexa/parametria:     Rt: Without masses or tenderness.   Lt: Without masses or tenderness.  Anus and perineum: Normal  Digital rectal exam: Normal sphincter tone without palpated masses or  tenderness  Assessment/Plan:  52 y.o. MWF G2, P2 for annual exam with no complaints.  Perimenopausal using no contraception Hypothyroid on Synthroid Hearing loss-reads lips  Plan: SBE's, continue annual screening mammogram, calcium rich foods, vitamin D 2000 daily encouraged.  Reviewed importance of continuing regular weightbearing exercise.  Contraception reviewed, declines.  CBC, CMP, lipid panel, TSH, Pap normal 2017, new screening guidelines reviewed.  Synthroid 88 mcg p.o. daily prescription, proper use given and reviewed.   [name], 11:01 AM 03/20/2018

## 2018-03-21 ENCOUNTER — Encounter (INDEPENDENT_AMBULATORY_CARE_PROVIDER_SITE_OTHER): Payer: Self-pay

## 2018-06-17 DIAGNOSIS — E039 Hypothyroidism, unspecified: Secondary | ICD-10-CM | POA: Diagnosis not present

## 2018-08-21 DIAGNOSIS — L821 Other seborrheic keratosis: Secondary | ICD-10-CM | POA: Diagnosis not present

## 2018-08-21 DIAGNOSIS — D2262 Melanocytic nevi of left upper limb, including shoulder: Secondary | ICD-10-CM | POA: Diagnosis not present

## 2018-08-21 DIAGNOSIS — Z86006 Personal history of melanoma in-situ: Secondary | ICD-10-CM | POA: Diagnosis not present

## 2018-09-13 DIAGNOSIS — E039 Hypothyroidism, unspecified: Secondary | ICD-10-CM | POA: Diagnosis not present

## 2019-01-01 ENCOUNTER — Telehealth: Payer: Self-pay | Admitting: *Deleted

## 2019-01-01 NOTE — Telephone Encounter (Signed)
Patient informed. 

## 2019-01-01 NOTE — Telephone Encounter (Signed)
Please call, blood sugars have been normal here in the past when checked.  She did have a colonoscopy with Dr. Hilarie Fredrickson at [facility] in 2017 may be best to follow-up with him.  She is hypothyroid is she still taking same dose, missed doses?  Has she been exposed to anyone with a virus?  Encourage her to drink some Gatorade.

## 2019-01-01 NOTE — Telephone Encounter (Signed)
Patient called Perimenopausal reports having issues with vomiting and dizziness, on Monday was the worse had to call out of work due to severe vomiting. Still having cycle, vomiting/dizziness is not timed around her cycle. States she looked online and said these symptoms maybe related to low glucose levels? Drinks plenty of water, does not eat out, doesn't drink sodas. I asked how long this has been going on states 1 year, but never this bad this past Monday, typically if she feels this way she can lay down and rest and feel better. Any recommendations would be helpful. Please advise

## 2019-01-06 ENCOUNTER — Telehealth: Payer: Self-pay | Admitting: *Deleted

## 2019-01-06 MED ORDER — LEVOTHYROXINE SODIUM 88 MCG PO TABS: ORAL_TABLET | ORAL | 0 refills | Status: DC

## 2019-01-06 NOTE — Telephone Encounter (Signed)
Patient called requesting synthroid 88 mcg tablet be sent to [facility]. Rx sent with refill until 02/2019.

## 2019-02-04 ENCOUNTER — Other Ambulatory Visit: Payer: Self-pay | Admitting: Women's Health

## 2019-02-04 DIAGNOSIS — Z1231 Encounter for screening mammogram for malignant neoplasm of breast: Secondary | ICD-10-CM

## 2019-03-25 ENCOUNTER — Other Ambulatory Visit: Payer: Self-pay

## 2019-03-26 ENCOUNTER — Encounter: Payer: Self-pay | Admitting: Women's Health

## 2019-03-26 ENCOUNTER — Ambulatory Visit (INDEPENDENT_AMBULATORY_CARE_PROVIDER_SITE_OTHER): Payer: BC Managed Care – PPO | Admitting: Women's Health

## 2019-03-26 VITALS — BP 128/80 | Ht 62.0 in | Wt 162.0 lb

## 2019-03-26 DIAGNOSIS — Z01419 Encounter for gynecological examination (general) (routine) without abnormal findings: Secondary | ICD-10-CM | POA: Diagnosis not present

## 2019-03-26 DIAGNOSIS — E039 Hypothyroidism, unspecified: Secondary | ICD-10-CM

## 2019-03-26 DIAGNOSIS — Z13220 Encounter for screening for lipoid disorders: Secondary | ICD-10-CM

## 2019-03-26 DIAGNOSIS — E559 Vitamin D deficiency, unspecified: Secondary | ICD-10-CM | POA: Diagnosis not present

## 2019-03-26 MED ORDER — LEVOTHYROXINE SODIUM 88 MCG PO TABS: ORAL_TABLET | ORAL | 4 refills | Status: DC

## 2019-03-26 NOTE — Addendum Note (Signed)
Addended by: Lorine Bears on: 03/26/2019 09:22 AM   Modules accepted: Orders

## 2019-03-26 NOTE — Patient Instructions (Signed)
Health Maintenance for Postmenopausal Women Menopause is a normal process in which your reproductive ability comes to an end. This process happens gradually over a span of months to years, usually between the ages of 62 and 89. Menopause is complete when you have missed 12 consecutive menstrual periods. It is important to talk with your health care provider about some of the most common conditions that affect postmenopausal women, such as heart disease, cancer, and bone loss (osteoporosis). Adopting a healthy lifestyle and getting preventive care can help to promote your health and wellness. Those actions can also lower your chances of developing some of these common conditions. What should I know about menopause? During menopause, you may experience a number of symptoms, such as:  Moderate-to-severe hot flashes.  Night sweats.  Decrease in sex drive.  Mood swings.  Headaches.  Tiredness.  Irritability.  Memory problems.  Insomnia. Choosing to treat or not to treat menopausal changes is an individual decision that you make with your health care provider. What should I know about hormone replacement therapy and supplements? Hormone therapy products are effective for treating symptoms that are associated with menopause, such as hot flashes and night sweats. Hormone replacement carries certain risks, especially as you become older. If you are thinking about using estrogen or estrogen with progestin treatments, discuss the benefits and risks with your health care provider. What should I know about heart disease and stroke? Heart disease, heart attack, and stroke become more likely as you age. This may be due, in part, to the hormonal changes that your body experiences during menopause. These can affect how your body processes dietary fats, triglycerides, and cholesterol. Heart attack and stroke are both medical emergencies. There are many things that you can do to help prevent heart disease  and stroke:  Have your blood pressure checked at least every 1-2 years. High blood pressure causes heart disease and increases the risk of stroke.  If you are 79-72 years old, ask your health care provider if you should take aspirin to prevent a heart attack or a stroke.  Do not use any tobacco products, including cigarettes, chewing tobacco, or electronic cigarettes. If you need help quitting, ask your health care provider.  It is important to eat a healthy diet and maintain a healthy weight. ? Be sure to include plenty of vegetables, fruits, low-fat dairy products, and lean protein. ? Avoid eating foods that are high in solid fats, added sugars, or salt (sodium).  Get regular exercise. This is one of the most important things that you can do for your health. ? Try to exercise for at least 150 minutes each week. The type of exercise that you do should increase your heart rate and make you sweat. This is known as moderate-intensity exercise. ? Try to do strengthening exercises at least twice each week. Do these in addition to the moderate-intensity exercise.  Know your numbers.Ask your health care provider to check your cholesterol and your blood glucose. Continue to have your blood tested as directed by your health care provider.  What should I know about cancer screening? There are several types of cancer. Take the following steps to reduce your risk and to catch any cancer development as early as possible. Breast Cancer  Practice breast self-awareness. ? This means understanding how your breasts normally appear and feel. ? It also means doing regular breast self-exams. Let your health care provider know about any changes, no matter how small.  If you are 40 or  older, have a clinician do a breast exam (clinical breast exam or CBE) every year. Depending on your age, family history, and medical history, it may be recommended that you also have a yearly breast X-ray (mammogram).  If you  have a family history of breast cancer, talk with your health care provider about genetic screening.  If you are at high risk for breast cancer, talk with your health care provider about having an MRI and a mammogram every year.  Breast cancer (BRCA) gene test is recommended for women who have family members with BRCA-related cancers. Results of the assessment will determine the need for genetic counseling and BRCA1 and for BRCA2 testing. BRCA-related cancers include these types: ? Breast. This occurs in males or females. ? Ovarian. ? Tubal. This may also be called fallopian tube cancer. ? Cancer of the abdominal or pelvic lining (peritoneal cancer). ? Prostate. ? Pancreatic. Cervical, Uterine, and Ovarian Cancer Your health care provider may recommend that you be screened regularly for cancer of the pelvic organs. These include your ovaries, uterus, and vagina. This screening involves a pelvic exam, which includes checking for microscopic changes to the surface of your cervix (Pap test).  For women ages 21-65, health care providers may recommend a pelvic exam and a Pap test every three years. For women ages 39-65, they may recommend the Pap test and pelvic exam, combined with testing for human papilloma virus (HPV), every five years. Some types of HPV increase your risk of cervical cancer. Testing for HPV may also be done on women of any age who have unclear Pap test results.  Other health care providers may not recommend any screening for nonpregnant women who are considered low risk for pelvic cancer and have no symptoms. Ask your health care provider if a screening pelvic exam is right for you.  If you have had past treatment for cervical cancer or a condition that could lead to cancer, you need Pap tests and screening for cancer for at least 20 years after your treatment. If Pap tests have been discontinued for you, your risk factors (such as having a new sexual partner) need to be reassessed  to determine if you should start having screenings again. Some women have medical problems that increase the chance of getting cervical cancer. In these cases, your health care provider may recommend that you have screening and Pap tests more often.  If you have a family history of uterine cancer or ovarian cancer, talk with your health care provider about genetic screening.  If you have vaginal bleeding after reaching menopause, tell your health care provider.  There are currently no reliable tests available to screen for ovarian cancer. Lung Cancer Lung cancer screening is recommended for adults 57-50 years old who are at high risk for lung cancer because of a history of smoking. A yearly low-dose CT scan of the lungs is recommended if you:  Currently smoke.  Have a history of at least 30 pack-years of smoking and you currently smoke or have quit within the past 15 years. A pack-year is smoking an average of one pack of cigarettes per day for one year. Yearly screening should:  Continue until it has been 15 years since you quit.  Stop if you develop a health problem that would prevent you from having lung cancer treatment. Colorectal Cancer  This type of cancer can be detected and can often be prevented.  Routine colorectal cancer screening usually begins at age 12 and continues through  age 63.  If you have risk factors for colon cancer, your health care provider may recommend that you be screened at an earlier age.  If you have a family history of colorectal cancer, talk with your health care provider about genetic screening.  Your health care provider may also recommend using home test kits to check for hidden blood in your stool.  A small camera at the end of a tube can be used to examine your colon directly (sigmoidoscopy or colonoscopy). This is done to check for the earliest forms of colorectal cancer.  Direct examination of the colon should be repeated every 5-10 years until  age 75. However, if early forms of precancerous polyps or small growths are found or if you have a family history or genetic risk for colorectal cancer, you may need to be screened more often. Skin Cancer  Check your skin from head to toe regularly.  Monitor any moles. Be sure to tell your health care provider: ? About any new moles or changes in moles, especially if there is a change in a mole's shape or color. ? If you have a mole that is larger than the size of a pencil eraser.  If any of your family members has a history of skin cancer, especially at a  age, talk with your health care provider about genetic screening.  Always use sunscreen. Apply sunscreen liberally and repeatedly throughout the day.  Whenever you are outside, protect yourself by wearing long sleeves, pants, a wide-brimmed hat, and sunglasses. What should I know about osteoporosis? Osteoporosis is a condition in which bone destruction happens more quickly than new bone creation. After menopause, you may be at an increased risk for osteoporosis. To help prevent osteoporosis or the bone fractures that can happen because of osteoporosis, the following is recommended:  If you are 59-59 years old, get at least 1,000 mg of calcium and at least 600 mg of vitamin D per day.  If you are older than age 36 but er than age 32, get at least 1,200 mg of calcium and at least 600 mg of vitamin D per day.  If you are older than age 47, get at least 1,200 mg of calcium and at least 800 mg of vitamin D per day. Smoking and excessive alcohol intake increase the risk of osteoporosis. Eat foods that are rich in calcium and vitamin D, and do weight-bearing exercises several times each week as directed by your health care provider. What should I know about how menopause affects my mental health? Depression may occur at any age, but it is more common as you become older. Common symptoms of depression include:  Low or sad mood.   Changes in sleep patterns.  Changes in appetite or eating patterns.  Feeling an overall lack of motivation or enjoyment of activities that you previously enjoyed.  Frequent crying spells. Talk with your health care provider if you think that you are experiencing depression. What should I know about immunizations? It is important that you get and maintain your immunizations. These include:  Tetanus, diphtheria, and pertussis (Tdap) booster vaccine.  Influenza every year before the flu season begins.  Pneumonia vaccine.  Shingles vaccine. Your health care provider may also recommend other immunizations. This information is not intended to replace advice given to you by your health care provider. Make sure you discuss any questions you have with your health care provider. Document Released: 12/01/2005 Document Revised: 04/28/2016 Document Reviewed: 07/13/2015 Elsevier Interactive Patient Education  2019 [place].

## 2019-03-26 NOTE — Progress Notes (Signed)
Kimberly Hill 1966/09/18 [id number]    History:    Presents for annual exam.  Cycles mostly every month using no contraception for many years without pregnancy.  Having occasional hot flushes, some stress incontinence.  Normal Pap and mammogram history.  Hypothyroid on Synthroid 88 mcg.  Has a history of a negative thyroid biopsy.  2017 benign colon polyp 10-year follow-up.  Legally deaf but reads lips well and able to communicate.  Works full-time at a bank.  Past medical history, past surgical history, family history and social history were all reviewed and documented in the EPIC chart.  Son recently graduated from college in [place], job on hold, daughter recently graduated from high school planning to go to Agricultural consultant school in [place].  Father hypertension.  ROS:  A ROS was performed and pertinent positives and negatives are included.  Exam:  Vitals:   03/26/19 0830  BP: 128/80  Weight: 162 lb (73.5 kg)  Height: 5\' 2"  (1.575 m)   Body mass index is 29.63 kg/m.   General appearance:  Normal Thyroid:  Symmetrical, normal in size, without palpable masses or nodularity. Respiratory  Auscultation:  Clear without wheezing or rhonchi Cardiovascular  Auscultation:  Regular rate, without rubs, murmurs or gallops  Edema/varicosities:  Not grossly evident Abdominal  Soft,nontender, without masses, guarding or rebound.  Liver/spleen:  No organomegaly noted  Hernia:  None appreciated  Skin  Inspection:  Grossly normal   Breasts: Examined lying and sitting.     Right: Without masses, retractions, discharge or axillary adenopathy.     Left: Without masses, retractions, discharge or axillary adenopathy. Gentitourinary   Inguinal/mons:  Normal without inguinal adenopathy  External genitalia:  Normal  BUS/Urethra/Skene's glands:  Normal  Vagina:  Normal  Cervix:  Normal  Uterus:   normal in size, shape and contour.  Midline and mobile  Adnexa/parametria:     Rt: Without masses or  tenderness.   Lt: Without masses or tenderness.  Anus and perineum: Normal  Digital rectal exam: Normal sphincter tone without palpated masses or tenderness  Assessment/Plan:  53 y.o. WF G2 P2 for annual exam with complaint of occasional stress incontinence.  Mostly monthly cycle with some menopausal symptoms Hypothyroid on Synthroid Legally deaf/reads lips able to communicate  Plan: Menopause reviewed, declines contraception.  SBEs, continue annual screening mammogram, has scheduled this month.  Continue regular exercise, walks most days, calcium rich foods, vitamin D 2000 daily encouraged.  Stress incontinence discussed.  Synthroid 88 mcg p.o. daily prescription, proper use given and reviewed.  CBC, CMP, lipid panel, TSH, Pap with HR HPV typing, vitamin D.    [facility], 9:10 AM 03/26/2019

## 2019-03-27 ENCOUNTER — Other Ambulatory Visit: Payer: Self-pay | Admitting: Obstetrics & Gynecology

## 2019-03-27 LAB — LIPID PANEL
Cholesterol: 225 mg/dL — ABNORMAL HIGH (ref <200)
HDL: 60 mg/dL (ref >=50)
LDL Cholesterol (Calc): 142 mg/dL (calc) — ABNORMAL HIGH
Non-HDL Cholesterol (Calc): 165 mg/dL (calc) — ABNORMAL HIGH (ref <130)
Total CHOL/HDL Ratio: 3.8 (calc) (ref <5.0)
Triglycerides: 116 mg/dL (ref <150)

## 2019-03-27 LAB — COMPREHENSIVE METABOLIC PANEL
AG Ratio: 1.8 (calc) (ref 1.0–2.5)
ALT: 19 U/L (ref 6–29)
AST: 21 U/L (ref 10–35)
Albumin: 4.2 g/dL (ref 3.6–5.1)
Alkaline phosphatase (APISO): 59 U/L (ref 37–153)
BUN: 20 mg/dL (ref 7–25)
CO2: 28 mmol/L (ref 20–32)
Calcium: 9.8 mg/dL (ref 8.6–10.4)
Chloride: 106 mmol/L (ref 98–110)
Creat: 0.89 mg/dL (ref 0.50–1.05)
Globulin: 2.4 g/dL (calc) (ref 1.9–3.7)
Glucose, Bld: 94 mg/dL (ref 65–99)
Potassium: 4 mmol/L (ref 3.5–5.3)
Sodium: 140 mmol/L (ref 135–146)
Total Bilirubin: 0.5 mg/dL (ref 0.2–1.2)
Total Protein: 6.6 g/dL (ref 6.1–8.1)

## 2019-03-27 LAB — CBC WITH DIFFERENTIAL/PLATELET
Absolute Monocytes: 429 cells/uL (ref 200–950)
Basophils Absolute: 29 cells/uL (ref 0–200)
Basophils Relative: 0.5 %
Eosinophils Absolute: 70 cells/uL (ref 15–500)
Eosinophils Relative: 1.2 %
HCT: 41.5 % (ref 35.0–45.0)
Hemoglobin: 13.5 g/dL (ref 11.7–15.5)
Lymphs Abs: 1293 cells/uL (ref 850–3900)
MCH: 29.2 pg (ref 27.0–33.0)
MCHC: 32.5 g/dL (ref 32.0–36.0)
MCV: 89.8 fL (ref 80.0–100.0)
MPV: 11 fL (ref 7.5–12.5)
Monocytes Relative: 7.4 %
Neutro Abs: 3979 cells/uL (ref 1500–7800)
Neutrophils Relative %: 68.6 %
Platelets: 208 10*3/uL (ref 140–400)
RBC: 4.62 10*6/uL (ref 3.80–5.10)
RDW: 12.8 % (ref 11.0–15.0)
Total Lymphocyte: 22.3 %
WBC: 5.8 10*3/uL (ref 3.8–10.8)

## 2019-03-27 LAB — VITAMIN D 25 HYDROXY (VIT D DEFICIENCY, FRACTURES): Vit D, 25-Hydroxy: 16 ng/mL — ABNORMAL LOW (ref 30–100)

## 2019-03-27 LAB — TSH: TSH: 2.04 mIU/L

## 2019-03-27 MED ORDER — VITAMIN D (ERGOCALCIFEROL) 1.25 MG (50000 UNIT) PO CAPS: 50000.0000 [IU] | ORAL_CAPSULE | ORAL | 0 refills | Status: DC

## 2019-03-28 LAB — PAP, TP IMAGING W/ HPV RNA, RFLX HPV TYPE 16,18/45: HPV DNA High Risk: NOT DETECTED

## 2019-04-04 ENCOUNTER — Ambulatory Visit
Admission: RE | Admit: 2019-04-04 | Discharge: 2019-04-04 | Disposition: A | Discharge status: Home / Self Care | Payer: BLUE CROSS/BLUE SHIELD | Source: Ambulatory Visit | Attending: Women's Health | Admitting: Women's Health

## 2019-04-04 ENCOUNTER — Other Ambulatory Visit: Payer: Self-pay

## 2019-04-04 DIAGNOSIS — Z1231 Encounter for screening mammogram for malignant neoplasm of breast: Secondary | ICD-10-CM | POA: Diagnosis not present

## 2019-06-08 DIAGNOSIS — Z1159 Encounter for screening for other viral diseases: Secondary | ICD-10-CM | POA: Diagnosis not present

## 2019-06-12 ENCOUNTER — Other Ambulatory Visit: Payer: Self-pay | Admitting: Women's Health

## 2019-06-17 ENCOUNTER — Other Ambulatory Visit: Payer: Self-pay

## 2019-06-17 DIAGNOSIS — Z20822 Contact with and (suspected) exposure to covid-19: Secondary | ICD-10-CM

## 2019-06-17 DIAGNOSIS — R6889 Other general symptoms and signs: Secondary | ICD-10-CM | POA: Diagnosis not present

## 2019-06-18 LAB — NOVEL CORONAVIRUS, NAA: SARS-CoV-2, NAA: NOT DETECTED

## 2019-06-27 ENCOUNTER — Other Ambulatory Visit: Payer: Self-pay | Admitting: Women's Health

## 2019-07-15 ENCOUNTER — Other Ambulatory Visit: Payer: Self-pay

## 2019-07-15 DIAGNOSIS — Z20822 Contact with and (suspected) exposure to covid-19: Secondary | ICD-10-CM

## 2019-07-15 DIAGNOSIS — R6889 Other general symptoms and signs: Secondary | ICD-10-CM | POA: Diagnosis not present

## 2019-07-16 LAB — NOVEL CORONAVIRUS, NAA: SARS-CoV-2, NAA: NOT DETECTED

## 2019-08-16 DIAGNOSIS — B353 Tinea pedis: Secondary | ICD-10-CM | POA: Diagnosis not present

## 2019-08-16 DIAGNOSIS — L03116 Cellulitis of left lower limb: Secondary | ICD-10-CM | POA: Diagnosis not present

## 2019-08-23 DIAGNOSIS — Z20828 Contact with and (suspected) exposure to other viral communicable diseases: Secondary | ICD-10-CM | POA: Diagnosis not present

## 2019-10-01 ENCOUNTER — Other Ambulatory Visit: Payer: Self-pay

## 2019-10-01 DIAGNOSIS — Z20822 Contact with and (suspected) exposure to covid-19: Secondary | ICD-10-CM

## 2019-10-03 LAB — NOVEL CORONAVIRUS, NAA: SARS-CoV-2, NAA: NOT DETECTED

## 2019-10-15 DIAGNOSIS — Z9189 Other specified personal risk factors, not elsewhere classified: Secondary | ICD-10-CM | POA: Diagnosis not present

## 2019-10-15 DIAGNOSIS — Z20828 Contact with and (suspected) exposure to other viral communicable diseases: Secondary | ICD-10-CM | POA: Diagnosis not present

## 2020-01-01 ENCOUNTER — Telehealth: Payer: Self-pay | Admitting: *Deleted

## 2020-01-01 NOTE — Telephone Encounter (Signed)
(  you are backup md) Patient called c/o 2 1/2 week of bleeding/cramping reports no cycle in 3 months. Patient said bleeding is getting lighter, wearing pads changing every 3-4 hours. Taking ibuprofen for cramping which helps. Patient would like recommendations if Rx could be sent to stop bleeding or OV? Please advise

## 2020-01-02 MED ORDER — MEDROXYPROGESTERONE ACETATE 10 MG PO TABS: 10.0000 mg | ORAL_TABLET | Freq: Every day | ORAL | 0 refills | Status: DC

## 2020-01-02 NOTE — Telephone Encounter (Signed)
Correct patient is premenopausal , Rx sent, patient informed with all the below.

## 2020-01-02 NOTE — Telephone Encounter (Signed)
I take it she is premenopausal? If so, then she can take Provera 10 mg daily for 10 days. If bleeding does not slow, or gets heavier, she should come in to be seen.

## 2020-02-09 ENCOUNTER — Ambulatory Visit: Payer: BC Managed Care – PPO | Attending: Internal Medicine

## 2020-02-09 DIAGNOSIS — Z20822 Contact with and (suspected) exposure to covid-19: Secondary | ICD-10-CM | POA: Insufficient documentation

## 2020-02-10 LAB — NOVEL CORONAVIRUS, NAA: SARS-CoV-2, NAA: NOT DETECTED

## 2020-02-10 LAB — SARS-COV-2, NAA 2 DAY TAT

## 2020-03-04 ENCOUNTER — Other Ambulatory Visit: Payer: Self-pay | Admitting: Women's Health

## 2020-03-04 ENCOUNTER — Other Ambulatory Visit: Payer: Self-pay | Admitting: Nurse Practitioner

## 2020-03-04 DIAGNOSIS — Z1231 Encounter for screening mammogram for malignant neoplasm of breast: Secondary | ICD-10-CM

## 2020-04-06 ENCOUNTER — Encounter: Payer: Self-pay | Admitting: Nurse Practitioner

## 2020-04-06 ENCOUNTER — Ambulatory Visit (INDEPENDENT_AMBULATORY_CARE_PROVIDER_SITE_OTHER): Payer: BC Managed Care – PPO | Admitting: Nurse Practitioner

## 2020-04-06 ENCOUNTER — Other Ambulatory Visit: Payer: Self-pay

## 2020-04-06 VITALS — BP 120/78 | Ht 62.0 in | Wt 164.0 lb

## 2020-04-06 DIAGNOSIS — N951 Menopausal and female climacteric states: Secondary | ICD-10-CM

## 2020-04-06 DIAGNOSIS — E039 Hypothyroidism, unspecified: Secondary | ICD-10-CM

## 2020-04-06 DIAGNOSIS — E559 Vitamin D deficiency, unspecified: Secondary | ICD-10-CM

## 2020-04-06 DIAGNOSIS — Z13220 Encounter for screening for lipoid disorders: Secondary | ICD-10-CM

## 2020-04-06 DIAGNOSIS — Z01419 Encounter for gynecological examination (general) (routine) without abnormal findings: Secondary | ICD-10-CM | POA: Diagnosis not present

## 2020-04-06 DIAGNOSIS — R5383 Other fatigue: Secondary | ICD-10-CM

## 2020-04-06 LAB — CBC WITH DIFFERENTIAL/PLATELET
Absolute Monocytes: 426 cells/uL (ref 200–950)
Basophils Absolute: 31 cells/uL (ref 0–200)
Basophils Relative: 0.6 %
Eosinophils Absolute: 88 cells/uL (ref 15–500)
Eosinophils Relative: 1.7 %
HCT: 40.5 % (ref 35.0–45.0)
Hemoglobin: 13.7 g/dL (ref 11.7–15.5)
Lymphs Abs: 1321 cells/uL (ref 850–3900)
MCH: 30.9 pg (ref 27.0–33.0)
MCHC: 33.8 g/dL (ref 32.0–36.0)
MCV: 91.2 fL (ref 80.0–100.0)
MPV: 11 fL (ref 7.5–12.5)
Monocytes Relative: 8.2 %
Neutro Abs: 3333 cells/uL (ref 1500–7800)
Neutrophils Relative %: 64.1 %
Platelets: 172 10*3/uL (ref 140–400)
RBC: 4.44 10*6/uL (ref 3.80–5.10)
RDW: 12.9 % (ref 11.0–15.0)
Total Lymphocyte: 25.4 %
WBC: 5.2 10*3/uL (ref 3.8–10.8)

## 2020-04-06 LAB — COMPREHENSIVE METABOLIC PANEL
AG Ratio: 1.9 (calc) (ref 1.0–2.5)
ALT: 20 U/L (ref 6–29)
AST: 21 U/L (ref 10–35)
Albumin: 4.4 g/dL (ref 3.6–5.1)
Alkaline phosphatase (APISO): 65 U/L (ref 37–153)
BUN: 16 mg/dL (ref 7–25)
CO2: 29 mmol/L (ref 20–32)
Calcium: 10.2 mg/dL (ref 8.6–10.4)
Chloride: 105 mmol/L (ref 98–110)
Creat: 0.83 mg/dL (ref 0.50–1.05)
Globulin: 2.3 g/dL (calc) (ref 1.9–3.7)
Glucose, Bld: 100 mg/dL — ABNORMAL HIGH (ref 65–99)
Potassium: 4 mmol/L (ref 3.5–5.3)
Sodium: 141 mmol/L (ref 135–146)
Total Bilirubin: 0.5 mg/dL (ref 0.2–1.2)
Total Protein: 6.7 g/dL (ref 6.1–8.1)

## 2020-04-06 LAB — LIPID PANEL
Cholesterol: 225 mg/dL — ABNORMAL HIGH (ref <200)
HDL: 53 mg/dL (ref >=50)
LDL Cholesterol (Calc): 149 mg/dL (calc) — ABNORMAL HIGH
Non-HDL Cholesterol (Calc): 172 mg/dL (calc) — ABNORMAL HIGH (ref <130)
Total CHOL/HDL Ratio: 4.2 (calc) (ref <5.0)
Triglycerides: 111 mg/dL (ref <150)

## 2020-04-06 LAB — VITAMIN D 25 HYDROXY (VIT D DEFICIENCY, FRACTURES): Vit D, 25-Hydroxy: 26 ng/mL — ABNORMAL LOW (ref 30–100)

## 2020-04-06 LAB — TSH: TSH: 7.57 mIU/L — ABNORMAL HIGH

## 2020-04-06 NOTE — Progress Notes (Signed)
   Kimberly Hill Apr 20, 1966 [id number]   History:  54 y.o. G2 P2 presents for annual exam.Cycles have become more irregular this past year. She may have a cycle once every few months and then not have one for 2-3 months. Complains of hot flashes, night sweats, and fatigue that occurs in the evenings. History of hypothyroidism and vitamin D deficiency. No PCP. 50% deaf.   Gynecologic History No LMP recorded.Perimenopausal    Contraception: none Last Pap: 03/26/2019. Results were: normal Last mammogram: 04/04/2019. Results were: normal Last colonoscopy: 08/03/2016. Results were: polyp, 10-year repeat  Past medical history, past surgical history, family history and social history were all reviewed and documented in the EPIC chart.  ROS:  A ROS was performed and pertinent positives and negatives are included.  Exam:  Vitals:   04/06/20 0826  BP: 120/78  Weight: 164 lb (74.4 kg)  Height: 5\' 2"  (1.575 m)   Body mass index is 30 kg/m.  General appearance:  Normal Thyroid:  Symmetrical, normal in size, without palpable masses or nodularity. Respiratory  Auscultation:  Clear without wheezing or rhonchi Cardiovascular  Auscultation:  Regular rate, without rubs, murmurs or gallops  Edema/varicosities:  Not grossly evident Abdominal  Soft,nontender, without masses, guarding or rebound.  Liver/spleen:  No organomegaly noted  Hernia:  None appreciated  Skin  Inspection:  Grossly normal   Breasts: Examined lying and sitting.   Right: Without masses, retractions, discharge or axillary adenopathy.   Left: Without masses, retractions, discharge or axillary adenopathy. Gentitourinary   Inguinal/mons:  Normal without inguinal adenopathy  External genitalia:  Normal  BUS/Urethra/Skene's glands:  Normal  Vagina:  Normal, atrophic changes  Cervix:  Normal  Uterus:  Anteverted, normal in size, shape and contour.  Midline and mobile  Adnexa/parametria:     Rt: Without masses or  tenderness.   Lt: Without masses or tenderness.  Anus and perineum: Normal  Digital rectal exam: Normal sphincter tone without palpated masses or tenderness  Assessment/Plan:  54 y.o. G2 P2 for annual exam.  Well female exam with routine gynecological exam - Plan: CBC with Differential/Platelet, Comprehensive metabolic panel. Education provided on SBEs, importance of preventative screenings, current guidelines, high calcium diet, regular exercise, and multivitamin daily.   Vitamin D deficiency - Plan: VITAMIN D 25 Hydroxy (Vit-D Deficiency, Fractures)  Hypothyroidism, unspecified type - Plan: TSH  Lipid screening - Plan: Lipid panel  Vasomotor symptoms due to menopause - hot flashes and night sweats occurring more often. We discussed treatment options and she wants to see lab work and think about it  Fatigue - C/o evening/nighttime fatigue. Will check TSH, Vitamin D, and Hemoglobin. May be related to perimenopause.   Follow up in 1 year for annual      [place], 8:35 AM 04/06/2020

## 2020-04-06 NOTE — Patient Instructions (Signed)
Health Maintenance, Female Adopting a healthy lifestyle and getting preventive care are important in promoting health and wellness. Ask your health care provider about:  The right schedule for you to have regular tests and exams.  Things you can do on your own to prevent diseases and keep yourself healthy. What should I know about diet, weight, and exercise? Eat a healthy diet   Eat a diet that includes plenty of vegetables, fruits, low-fat dairy products, and lean protein.  Do not eat a lot of foods that are high in solid fats, added sugars, or sodium. Maintain a healthy weight Body mass index (BMI) is used to identify weight problems. It estimates body fat based on height and weight. Your health care provider can help determine your BMI and help you achieve or maintain a healthy weight. Get regular exercise Get regular exercise. This is one of the most important things you can do for your health. Most adults should:  Exercise for at least 150 minutes each week. The exercise should increase your heart rate and make you sweat (moderate-intensity exercise).  Do strengthening exercises at least twice a week. This is in addition to the moderate-intensity exercise.  Spend less time sitting. Even light physical activity can be beneficial. Watch cholesterol and blood lipids Have your blood tested for lipids and cholesterol at 55 years of age, then have this test every 5 years. Have your cholesterol levels checked more often if:  Your lipid or cholesterol levels are high.  You are older than 55 years of age.  You are at high risk for heart disease. What should I know about cancer screening? Depending on your health history and family history, you may need to have cancer screening at various ages. This may include screening for:  Breast cancer.  Cervical cancer.  Colorectal cancer.  Skin cancer.  Lung cancer. What should I know about heart disease, diabetes, and high blood  pressure? Blood pressure and heart disease  High blood pressure causes heart disease and increases the risk of stroke. This is more likely to develop in people who have high blood pressure readings, are of African descent, or are overweight.  Have your blood pressure checked: ? Every 3-5 years if you are 18-39 years of age. ? Every year if you are 40 years old or older. Diabetes Have regular diabetes screenings. This checks your fasting blood sugar level. Have the screening done:  Once every three years after age 40 if you are at a normal weight and have a low risk for diabetes.  More often and at a younger age if you are overweight or have a high risk for diabetes. What should I know about preventing infection? Hepatitis B If you have a higher risk for hepatitis B, you should be screened for this virus. Talk with your health care provider to find out if you are at risk for hepatitis B infection. Hepatitis C Testing is recommended for:  Everyone born from 1945 through 1965.  Anyone with known risk factors for hepatitis C. Sexually transmitted infections (STIs)  Get screened for STIs, including gonorrhea and chlamydia, if: ? You are sexually active and are younger than 55 years of age. ? You are older than 55 years of age and your health care provider tells you that you are at risk for this type of infection. ? Your sexual activity has changed since you were last screened, and you are at increased risk for chlamydia or gonorrhea. Ask your health care provider if   you are at risk.  Ask your health care provider about whether you are at high risk for HIV. Your health care provider may recommend a prescription medicine to help prevent HIV infection. If you choose to take medicine to prevent HIV, you should first get tested for HIV. You should then be tested every 3 months for as long as you are taking the medicine. Pregnancy  If you are about to stop having your period (premenopausal) and  you may become pregnant, seek counseling before you get pregnant.  Take 400 to 800 micrograms (mcg) of folic acid every day if you become pregnant.  Ask for birth control (contraception) if you want to prevent pregnancy. Osteoporosis and menopause Osteoporosis is a disease in which the bones lose minerals and strength with aging. This can result in bone fractures. If you are 65 years old or older, or if you are at risk for osteoporosis and fractures, ask your health care provider if you should:  Be screened for bone loss.  Take a calcium or vitamin D supplement to lower your risk of fractures.  Be given hormone replacement therapy (HRT) to treat symptoms of menopause. Follow these instructions at home: Lifestyle  Do not use any products that contain nicotine or tobacco, such as cigarettes, e-cigarettes, and chewing tobacco. If you need help quitting, ask your health care provider.  Do not use street drugs.  Do not share needles.  Ask your health care provider for help if you need support or information about quitting drugs. Alcohol use  Do not drink alcohol if: ? Your health care provider tells you not to drink. ? You are pregnant, may be pregnant, or are planning to become pregnant.  If you drink alcohol: ? Limit how much you use to 0-1 drink a day. ? Limit intake if you are breastfeeding.  Be aware of how much alcohol is in your drink. In the [place] one drink equals one 12 oz bottle of beer (355 mL), one 5 oz glass of wine (148 mL), or one 1 oz glass of hard liquor (44 mL). General instructions  Schedule regular health, dental, and eye exams.  Stay current with your vaccines.  Tell your health care provider if: ? You often feel depressed. ? You have ever been abused or do not feel safe at home. Summary  Adopting a healthy lifestyle and getting preventive care are important in promoting health and wellness.  Follow your health care provider's instructions about healthy  diet, exercising, and getting tested or screened for diseases.  Follow your health care provider's instructions on monitoring your cholesterol and blood pressure. This information is not intended to replace advice given to you by your health care provider. Make sure you discuss any questions you have with your health care provider. Document Revised: 10/02/2018 Document Reviewed: 10/02/2018 Elsevier Patient Education  2020 [employer]  

## 2020-04-07 ENCOUNTER — Other Ambulatory Visit: Payer: Self-pay | Admitting: Nurse Practitioner

## 2020-04-07 DIAGNOSIS — E039 Hypothyroidism, unspecified: Secondary | ICD-10-CM

## 2020-04-07 DIAGNOSIS — E559 Vitamin D deficiency, unspecified: Secondary | ICD-10-CM

## 2020-04-07 MED ORDER — LEVOTHYROXINE SODIUM 100 MCG PO TABS: 100.0000 ug | ORAL_TABLET | Freq: Every day | ORAL | 0 refills | Status: DC

## 2020-04-07 MED ORDER — VITAMIN D (ERGOCALCIFEROL) 1.25 MG (50000 UNIT) PO CAPS: 50000.0000 [IU] | ORAL_CAPSULE | ORAL | 0 refills | Status: DC

## 2020-04-07 NOTE — Progress Notes (Signed)
TSH 7.57. Will increase synthroid to 100 mg daily. Vitamin D 26. 50000 units weekly x 8 weeks. Will recheck TSH and Vit D levels in 8 weeks.

## 2020-04-12 ENCOUNTER — Ambulatory Visit
Admission: RE | Admit: 2020-04-12 | Discharge: 2020-04-12 | Disposition: A | Discharge status: Home / Self Care | Payer: BC Managed Care – PPO | Source: Ambulatory Visit | Attending: *Deleted | Admitting: *Deleted

## 2020-04-12 ENCOUNTER — Other Ambulatory Visit: Payer: Self-pay

## 2020-04-12 DIAGNOSIS — Z1231 Encounter for screening mammogram for malignant neoplasm of breast: Secondary | ICD-10-CM

## 2020-04-13 ENCOUNTER — Other Ambulatory Visit: Payer: Self-pay

## 2020-04-13 DIAGNOSIS — E039 Hypothyroidism, unspecified: Secondary | ICD-10-CM

## 2020-05-25 ENCOUNTER — Other Ambulatory Visit: Payer: Self-pay | Admitting: Nurse Practitioner

## 2020-05-25 DIAGNOSIS — E559 Vitamin D deficiency, unspecified: Secondary | ICD-10-CM

## 2020-05-27 ENCOUNTER — Other Ambulatory Visit: Payer: BC Managed Care – PPO

## 2020-05-27 ENCOUNTER — Other Ambulatory Visit: Payer: Self-pay

## 2020-05-27 DIAGNOSIS — E039 Hypothyroidism, unspecified: Secondary | ICD-10-CM

## 2020-05-27 DIAGNOSIS — E559 Vitamin D deficiency, unspecified: Secondary | ICD-10-CM | POA: Diagnosis not present

## 2020-05-27 LAB — TSH: TSH: 0.88 mIU/L

## 2020-05-27 LAB — VITAMIN D 25 HYDROXY (VIT D DEFICIENCY, FRACTURES): Vit D, 25-Hydroxy: 57 ng/mL (ref 30–100)

## 2020-05-31 ENCOUNTER — Other Ambulatory Visit: Payer: Self-pay

## 2020-05-31 DIAGNOSIS — E039 Hypothyroidism, unspecified: Secondary | ICD-10-CM

## 2020-06-27 ENCOUNTER — Other Ambulatory Visit: Payer: Self-pay | Admitting: Nurse Practitioner

## 2020-06-27 DIAGNOSIS — E039 Hypothyroidism, unspecified: Secondary | ICD-10-CM

## 2020-07-23 ENCOUNTER — Other Ambulatory Visit: Payer: Self-pay

## 2020-07-23 ENCOUNTER — Other Ambulatory Visit: Payer: BC Managed Care – PPO

## 2020-07-23 DIAGNOSIS — E039 Hypothyroidism, unspecified: Secondary | ICD-10-CM | POA: Diagnosis not present

## 2020-07-23 LAB — TSH: TSH: 4.68 mIU/L — ABNORMAL HIGH

## 2020-07-25 ENCOUNTER — Other Ambulatory Visit: Payer: Self-pay | Admitting: Nurse Practitioner

## 2020-07-25 DIAGNOSIS — E039 Hypothyroidism, unspecified: Secondary | ICD-10-CM

## 2020-07-27 ENCOUNTER — Telehealth: Payer: Self-pay | Admitting: *Deleted

## 2020-07-27 NOTE — Telephone Encounter (Signed)
Patient came in for lab work last week and told Abigail Butts she wanted to discuss menopausal symptoms. I left message for patient to call.

## 2020-08-28 IMAGING — MG DIGITAL SCREENING BILAT W/ TOMO W/ CAD
8 series · 8 of 24 positions shown · non-contrast
Comparison: Previous exam(s).

CLINICAL DATA: Screening.

EXAM:
DIGITAL SCREENING BILATERAL MAMMOGRAM WITH TOMO AND CAD

[R CC synth-2D]
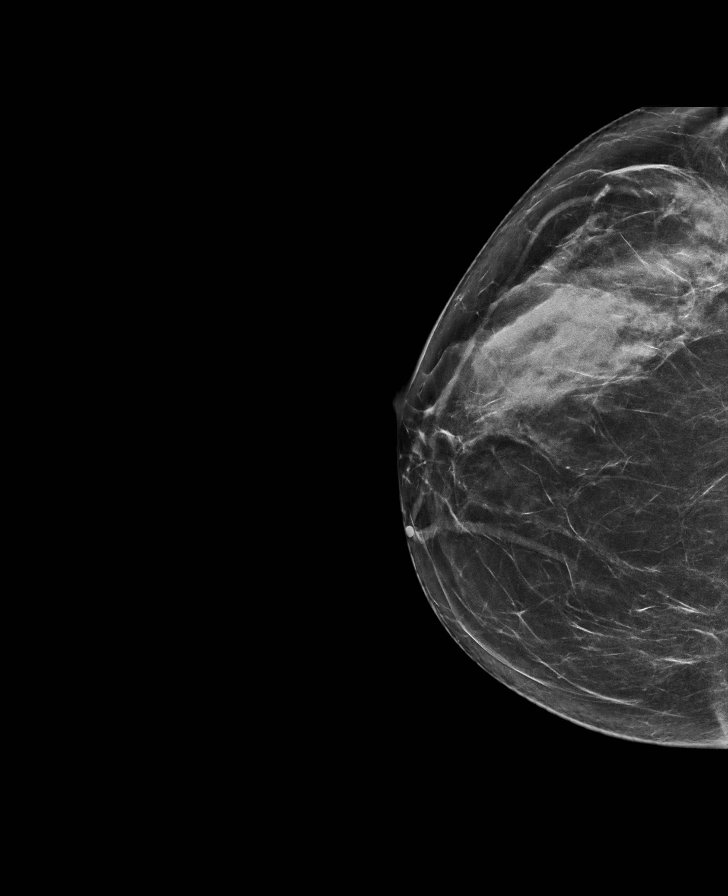

[R MLO synth-2D]
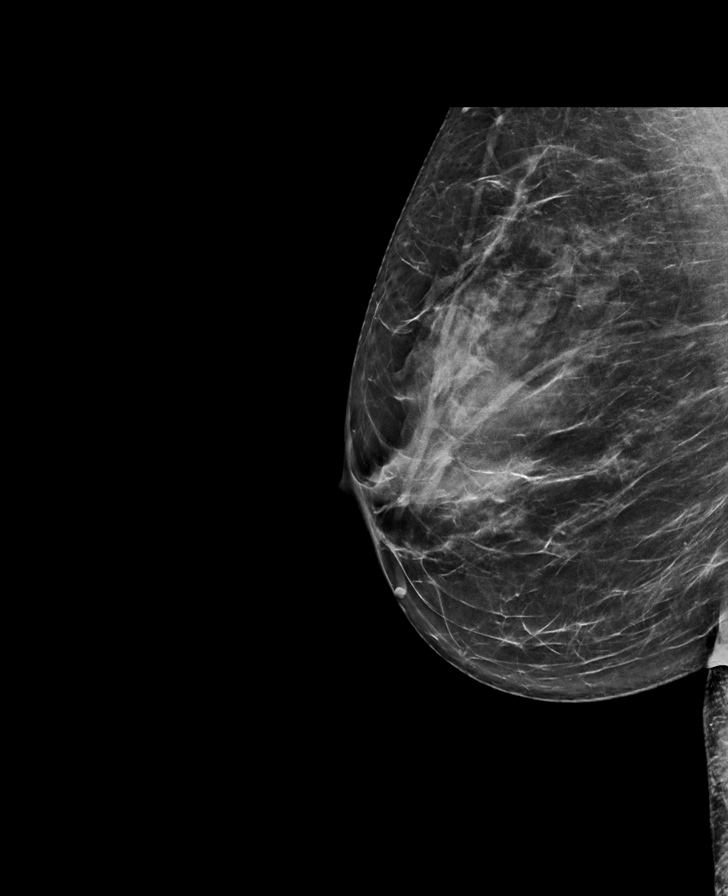

[L MLO synth-2D]
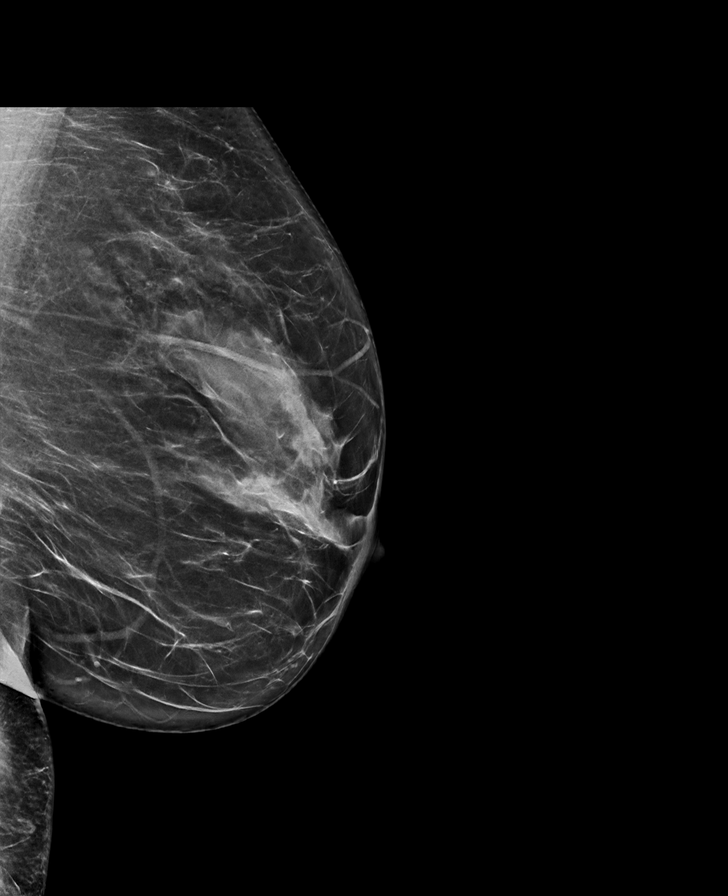

[L CC synth-2D]
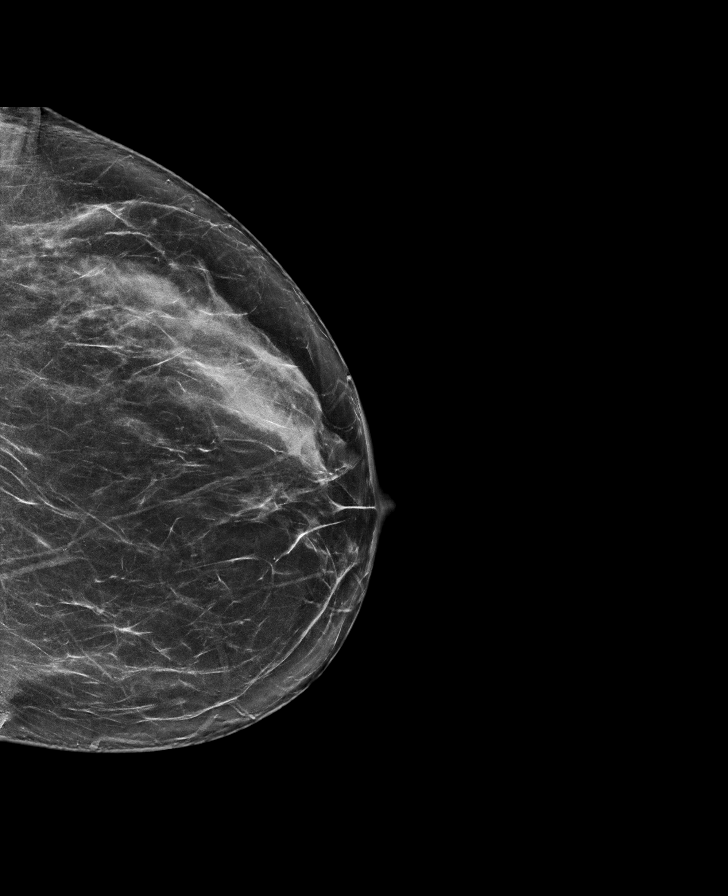

[R CC tomo · tomo slice 37/72.0]
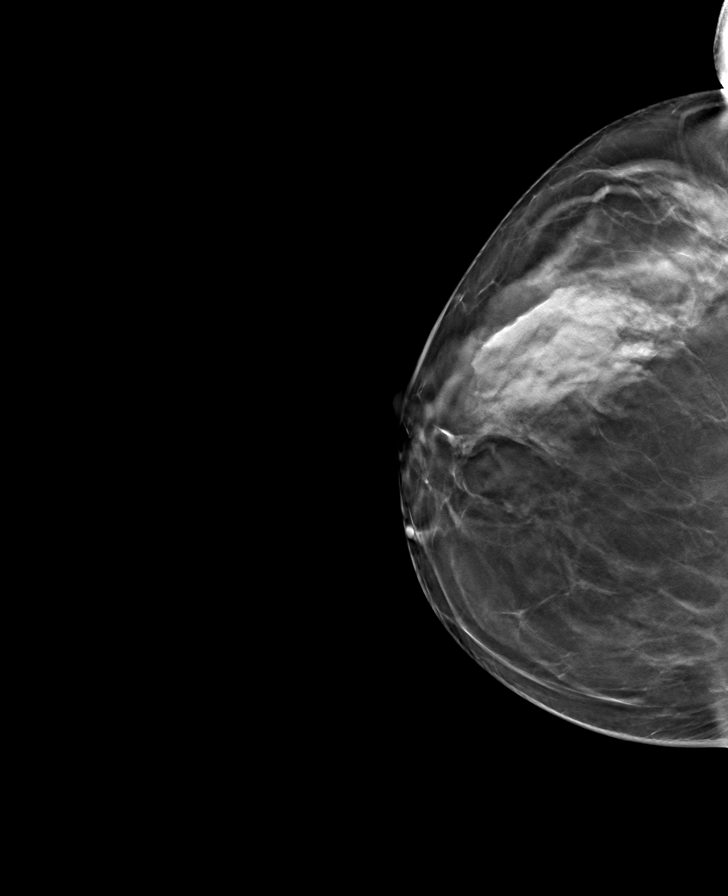

[L CC tomo · tomo slice 38/75.0]
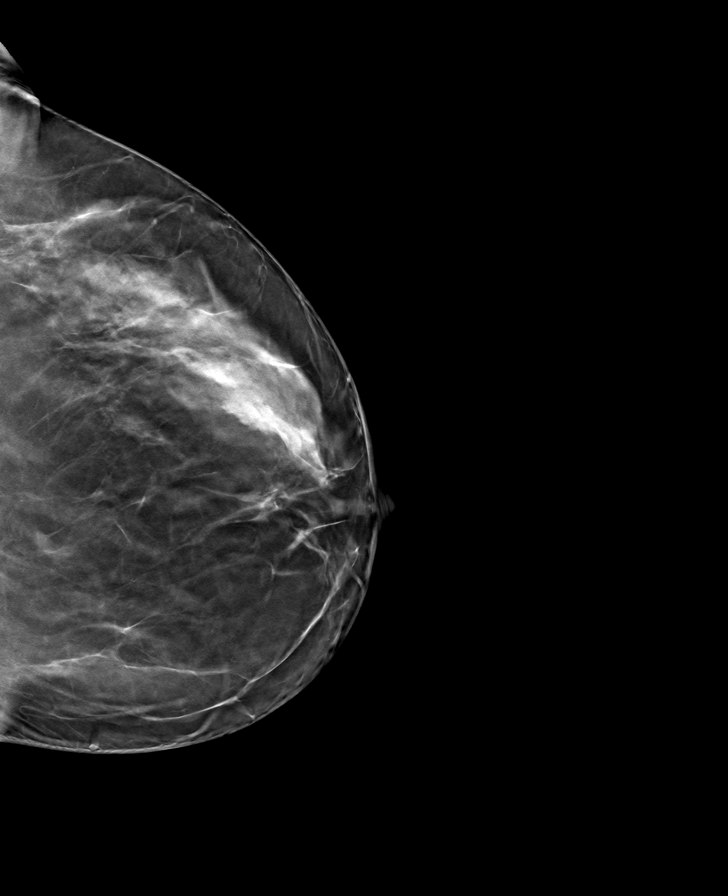

[L MLO tomo · tomo slice 39/76.0]
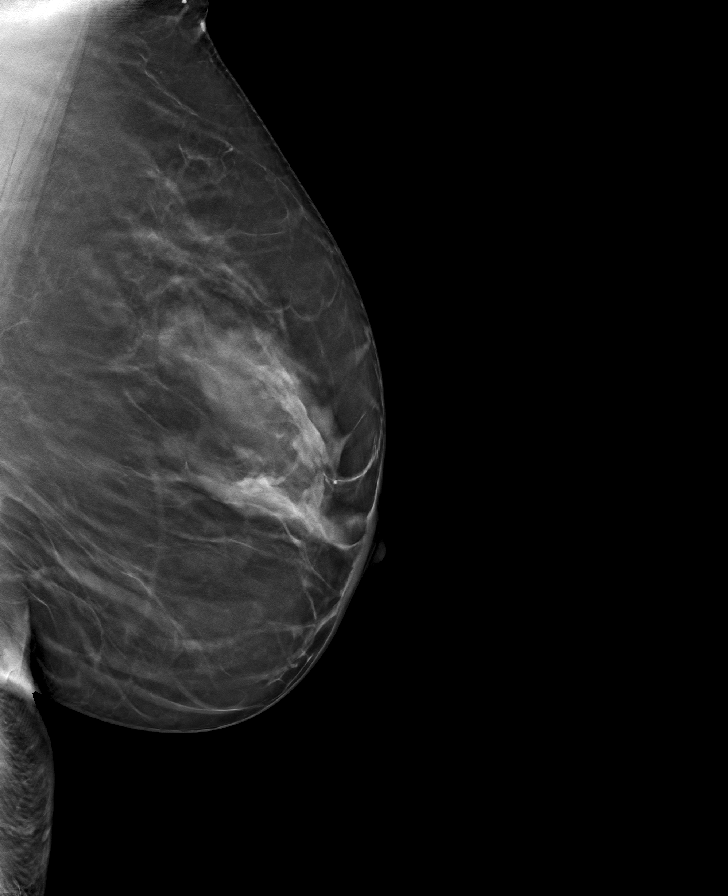

[R MLO tomo · tomo slice 35/70.0]
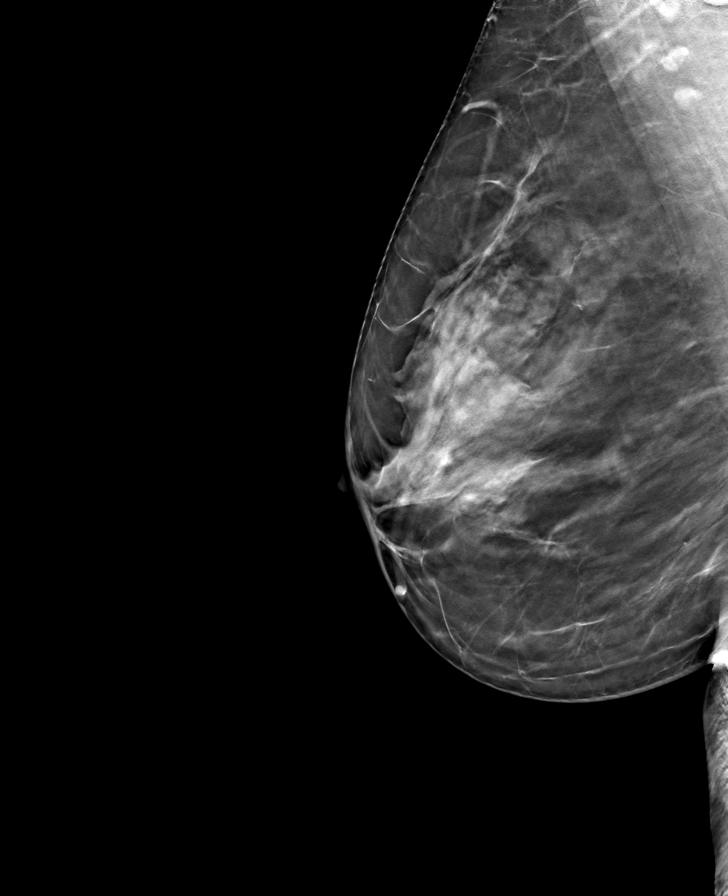

[8 of 24 positions shown; findings below may reference images not displayed]

ACR Breast Density Category c: The breast tissue is heterogeneously
dense, which may obscure small masses.
FINDINGS: There are no findings suspicious for malignancy. Images were
processed with CAD.
IMPRESSION: No mammographic evidence of malignancy. A result letter of this
screening mammogram will be mailed directly to the patient.

RECOMMENDATION:
Screening mammogram in one year. (Code:[id number]-[id number])

BI-RADS CATEGORY  1: Negative.

## 2020-09-02 DIAGNOSIS — D485 Neoplasm of uncertain behavior of skin: Secondary | ICD-10-CM | POA: Diagnosis not present

## 2020-09-02 DIAGNOSIS — D2262 Melanocytic nevi of left upper limb, including shoulder: Secondary | ICD-10-CM | POA: Diagnosis not present

## 2020-09-02 DIAGNOSIS — D2261 Melanocytic nevi of right upper limb, including shoulder: Secondary | ICD-10-CM | POA: Diagnosis not present

## 2020-09-02 DIAGNOSIS — D225 Melanocytic nevi of trunk: Secondary | ICD-10-CM | POA: Diagnosis not present

## 2020-09-02 DIAGNOSIS — L821 Other seborrheic keratosis: Secondary | ICD-10-CM | POA: Diagnosis not present

## 2020-09-02 DIAGNOSIS — Z86006 Personal history of melanoma in-situ: Secondary | ICD-10-CM | POA: Diagnosis not present

## 2020-09-28 ENCOUNTER — Other Ambulatory Visit: Payer: BC Managed Care – PPO

## 2020-10-08 ENCOUNTER — Other Ambulatory Visit: Payer: Self-pay

## 2020-10-08 ENCOUNTER — Other Ambulatory Visit: Payer: BC Managed Care – PPO

## 2020-10-08 DIAGNOSIS — E039 Hypothyroidism, unspecified: Secondary | ICD-10-CM

## 2020-10-09 LAB — THYROID PANEL WITH TSH
Free Thyroxine Index: 2.8 (ref 1.4–3.8)
T3 Uptake: 31 % (ref 22–35)
T4, Total: 9 ug/dL (ref 5.1–11.9)
TSH: 4.3 mIU/L

## 2020-10-10 ENCOUNTER — Other Ambulatory Visit: Payer: Self-pay | Admitting: Nurse Practitioner

## 2020-10-10 DIAGNOSIS — E039 Hypothyroidism, unspecified: Secondary | ICD-10-CM

## 2020-10-13 DIAGNOSIS — Z20822 Contact with and (suspected) exposure to covid-19: Secondary | ICD-10-CM | POA: Diagnosis not present

## 2021-03-28 ENCOUNTER — Other Ambulatory Visit: Payer: Self-pay | Admitting: Nurse Practitioner

## 2021-03-28 DIAGNOSIS — Z1231 Encounter for screening mammogram for malignant neoplasm of breast: Secondary | ICD-10-CM

## 2021-03-30 DIAGNOSIS — Z20822 Contact with and (suspected) exposure to covid-19: Secondary | ICD-10-CM | POA: Diagnosis not present

## 2021-03-30 DIAGNOSIS — Z20820 Contact with and (suspected) exposure to varicella: Secondary | ICD-10-CM | POA: Diagnosis not present

## 2021-03-31 DIAGNOSIS — Z20822 Contact with and (suspected) exposure to covid-19: Secondary | ICD-10-CM | POA: Diagnosis not present

## 2021-04-04 ENCOUNTER — Other Ambulatory Visit: Payer: Self-pay | Admitting: Nurse Practitioner

## 2021-04-04 DIAGNOSIS — E039 Hypothyroidism, unspecified: Secondary | ICD-10-CM

## 2021-04-11 ENCOUNTER — Ambulatory Visit: Payer: BC Managed Care – PPO | Admitting: Nurse Practitioner

## 2021-04-11 ENCOUNTER — Other Ambulatory Visit: Payer: Self-pay

## 2021-04-11 ENCOUNTER — Encounter: Payer: Self-pay | Admitting: Nurse Practitioner

## 2021-04-11 VITALS — BP 124/70 | HR 89 | Ht 62.5 in | Wt 170.0 lb

## 2021-04-11 DIAGNOSIS — Z01419 Encounter for gynecological examination (general) (routine) without abnormal findings: Secondary | ICD-10-CM

## 2021-04-11 DIAGNOSIS — Z8639 Personal history of other endocrine, nutritional and metabolic disease: Secondary | ICD-10-CM | POA: Diagnosis not present

## 2021-04-11 DIAGNOSIS — E559 Vitamin D deficiency, unspecified: Secondary | ICD-10-CM | POA: Diagnosis not present

## 2021-04-11 DIAGNOSIS — N951 Menopausal and female climacteric states: Secondary | ICD-10-CM

## 2021-04-11 DIAGNOSIS — E039 Hypothyroidism, unspecified: Secondary | ICD-10-CM | POA: Diagnosis not present

## 2021-04-11 DIAGNOSIS — Z79890 Hormone replacement therapy (postmenopausal): Secondary | ICD-10-CM

## 2021-04-11 MED ORDER — PROGESTERONE MICRONIZED 100 MG PO CAPS: 100.0000 mg | ORAL_CAPSULE | Freq: Every day | ORAL | 11 refills | Status: DC

## 2021-04-11 MED ORDER — ESTRADIOL 0.5 MG PO TABS: 0.5000 mg | ORAL_TABLET | Freq: Every day | ORAL | 11 refills | Status: DC

## 2021-04-11 NOTE — Progress Notes (Signed)
Kimberly Hill [birth year] [id number]   History:  55 y.o. G2P0002 presents for annual exam. Complains of low energy, palpitations, mood swings, insomnia, and hot flashes. LMP 7 months ago. She is emotional during visit today. She also recently sold home to downsize and started new job 8 months ago that has increased her stressload. Normal pap and mammogram history. History of hypothyroidism, Vitamin D deficiency.   Gynecologic History Patient's last menstrual period was 08/23/2020.   Contraception/Family planning: none  Health Maintenance Last Pap: 03/26/2019. Results were: Normal Last mammogram: 04/12/2020. Results were: Normal Last colonoscopy: 2017. Results were: benign polyp x 1, 10-year recall Last Dexa: Not indicated  Past medical history, past surgical history, family history and social history were all reviewed and documented in the EPIC chart. Married. Daughter and son in [place]. Works at [employer].   ROS:  A ROS was performed and pertinent positives and negatives are included.  Exam:  Vitals:   04/11/21 0900  BP: 124/70  Pulse: 89  SpO2: 97%  Weight: 170 lb (77.1 kg)  Height: 5' 2.5" (1.588 m)   Body mass index is 30.6 kg/m.  General appearance:  Normal Thyroid:  Symmetrical, normal in size, without palpable masses or nodularity. Respiratory  Auscultation:  Clear without wheezing or rhonchi Cardiovascular  Auscultation:  Regular rate, without rubs, murmurs or gallops  Edema/varicosities:  Not grossly evident Abdominal  Soft,nontender, without masses, guarding or rebound.  Liver/spleen:  No organomegaly noted  Hernia:  None appreciated  Skin  Inspection:  Grossly normal Breasts: Examined lying and sitting.   Right: Without masses, retractions, nipple discharge or axillary adenopathy.   Left: Without masses, retractions, nipple discharge or axillary adenopathy. Genitourinary   Inguinal/mons:  Normal without inguinal adenopathy  External genitalia:  Normal appearing  vulva with no masses, tenderness, or lesions  BUS/Urethra/Skene's glands:  Normal  Vagina:  Normal appearing with normal color and discharge, no lesions  Cervix:  Normal appearing without discharge or lesions  Uterus:  Normal in size, shape and contour.  Midline and mobile, nontender  Adnexa/parametria:     Rt: Normal in size, without masses or tenderness.   Lt: Normal in size, without masses or tenderness.  Anus and perineum: Normal  Digital rectal exam: Normal sphincter tone without palpated masses or tenderness  Assessment/Plan:  55 y.o. G2P0002 for annual exam.   Well female exam with routine gynecological exam - Plan: CBC with Differential/Platelet, Comprehensive metabolic panel, Lipid panel. Education provided on SBEs, importance of preventative screenings, current guidelines, high calcium diet, regular exercise, and multivitamin daily.  Vasomotor symptoms due to menopause - Plan: estradiol (ESTRACE) 0.5 MG tablet, progesterone (PROMETRIUM) 100 MG capsule. Complains of low energy, palpitations, mood swings, insomnia, and hot flashes. LMP 7 months ago. She is emotional during visit today.   Hypothyroidism, unspecified type - Plan: TSH. Currently taking Synthroid 100 mcg daily. Is aware not to take HRT for at least 1 hour after taking Synthroid.   History of vitamin D deficiency - Plan: VITAMIN D 25 Hydroxy (Vit-D Deficiency, Fractures)  Hormone replacement therapy (HRT) - Plan: estradiol (ESTRACE) 0.5 MG tablet, progesterone (PROMETRIUM) 100 MG capsule. We had long discussion regarding HRT to include proper use, benefits of symptom management, cardiac and bone health, as well as the risks for blood clots, heart attack, stroke, and breast cancer. She would like to start and all questions were answered.   Screening for cervical cancer - Normal Pap history.  Will repeat at 5-year interval per guidelines.  Screening for breast cancer - Normal mammogram history.  Continue annual screenings.   Normal breast exam today.  Screening for colon cancer - 2017 colonoscopy. Will repeat at GI's recommended interval.   Return in 1 year for annual.    Tamela Gammon DNP, 9:43 AM 04/11/2021

## 2021-04-12 ENCOUNTER — Other Ambulatory Visit: Payer: Self-pay | Admitting: Nurse Practitioner

## 2021-04-12 DIAGNOSIS — E039 Hypothyroidism, unspecified: Secondary | ICD-10-CM

## 2021-04-12 DIAGNOSIS — E559 Vitamin D deficiency, unspecified: Secondary | ICD-10-CM

## 2021-04-12 LAB — COMPREHENSIVE METABOLIC PANEL
AG Ratio: 1.6 (calc) (ref 1.0–2.5)
ALT: 28 U/L (ref 6–29)
AST: 25 U/L (ref 10–35)
Albumin: 4.2 g/dL (ref 3.6–5.1)
Alkaline phosphatase (APISO): 73 U/L (ref 37–153)
BUN: 17 mg/dL (ref 7–25)
CO2: 32 mmol/L (ref 20–32)
Calcium: 10.3 mg/dL (ref 8.6–10.4)
Chloride: 105 mmol/L (ref 98–110)
Creat: 0.94 mg/dL (ref 0.50–1.05)
Globulin: 2.7 g/dL (calc) (ref 1.9–3.7)
Glucose, Bld: 96 mg/dL (ref 65–99)
Potassium: 4 mmol/L (ref 3.5–5.3)
Sodium: 142 mmol/L (ref 135–146)
Total Bilirubin: 0.5 mg/dL (ref 0.2–1.2)
Total Protein: 6.9 g/dL (ref 6.1–8.1)

## 2021-04-12 LAB — VITAMIN D 25 HYDROXY (VIT D DEFICIENCY, FRACTURES): Vit D, 25-Hydroxy: 19 ng/mL — ABNORMAL LOW (ref 30–100)

## 2021-04-12 LAB — CBC WITH DIFFERENTIAL/PLATELET
Absolute Monocytes: 595 cells/uL (ref 200–950)
Basophils Absolute: 10 cells/uL (ref 0–200)
Basophils Relative: 0.2 %
Eosinophils Absolute: 50 cells/uL (ref 15–500)
Eosinophils Relative: 1 %
HCT: 40.6 % (ref 35.0–45.0)
Hemoglobin: 13.7 g/dL (ref 11.7–15.5)
Lymphs Abs: 775 cells/uL — ABNORMAL LOW (ref 850–3900)
MCH: 30 pg (ref 27.0–33.0)
MCHC: 33.7 g/dL (ref 32.0–36.0)
MCV: 89 fL (ref 80.0–100.0)
MPV: 10.9 fL (ref 7.5–12.5)
Monocytes Relative: 11.9 %
Neutro Abs: 3570 cells/uL (ref 1500–7800)
Neutrophils Relative %: 71.4 %
Platelets: 176 10*3/uL (ref 140–400)
RBC: 4.56 10*6/uL (ref 3.80–5.10)
RDW: 12.9 % (ref 11.0–15.0)
Total Lymphocyte: 15.5 %
WBC: 5 10*3/uL (ref 3.8–10.8)

## 2021-04-12 LAB — LIPID PANEL
Cholesterol: 200 mg/dL — ABNORMAL HIGH (ref <200)
HDL: 57 mg/dL (ref >=50)
LDL Cholesterol (Calc): 127 mg/dL (calc) — ABNORMAL HIGH
Non-HDL Cholesterol (Calc): 143 mg/dL (calc) — ABNORMAL HIGH (ref <130)
Total CHOL/HDL Ratio: 3.5 (calc) (ref <5.0)
Triglycerides: 68 mg/dL (ref <150)

## 2021-04-12 LAB — TSH: TSH: 0.41 mIU/L

## 2021-04-12 MED ORDER — LEVOTHYROXINE SODIUM 100 MCG PO TABS
100.0000 ug | ORAL_TABLET | Freq: Every day | ORAL | 3 refills | Status: DC
Start: 2021-04-12 — End: 2022-04-03

## 2021-04-12 MED ORDER — VITAMIN D (ERGOCALCIFEROL) 1.25 MG (50000 UNIT) PO CAPS: 50000.0000 [IU] | ORAL_CAPSULE | ORAL | 0 refills | Status: AC

## 2021-05-03 ENCOUNTER — Other Ambulatory Visit: Payer: Self-pay | Admitting: Nurse Practitioner

## 2021-05-03 DIAGNOSIS — Z79890 Hormone replacement therapy (postmenopausal): Secondary | ICD-10-CM

## 2021-05-03 DIAGNOSIS — N951 Menopausal and female climacteric states: Secondary | ICD-10-CM

## 2021-05-03 NOTE — Telephone Encounter (Signed)
Pharmacy note "REQUEST FOR 90 DAYS PRESCRIPTION. DX Code Needed.  90 day supply sent with refills and Dx code attached to Rx.

## 2021-05-23 ENCOUNTER — Ambulatory Visit
Admission: RE | Admit: 2021-05-23 | Discharge: 2021-05-23 | Disposition: A | Discharge status: Home / Self Care | Payer: BC Managed Care – PPO | Source: Ambulatory Visit | Attending: Nurse Practitioner | Admitting: Nurse Practitioner

## 2021-05-23 ENCOUNTER — Other Ambulatory Visit: Payer: Self-pay

## 2021-05-23 DIAGNOSIS — Z1231 Encounter for screening mammogram for malignant neoplasm of breast: Secondary | ICD-10-CM

## 2021-06-27 ENCOUNTER — Other Ambulatory Visit: Payer: Self-pay | Admitting: Nurse Practitioner

## 2021-06-27 DIAGNOSIS — E559 Vitamin D deficiency, unspecified: Secondary | ICD-10-CM

## 2021-08-05 ENCOUNTER — Ambulatory Visit: Payer: BC Managed Care – PPO | Admitting: Nurse Practitioner

## 2021-08-05 ENCOUNTER — Other Ambulatory Visit: Payer: Self-pay

## 2021-08-05 VITALS — BP 122/76

## 2021-08-05 DIAGNOSIS — N939 Abnormal uterine and vaginal bleeding, unspecified: Secondary | ICD-10-CM | POA: Diagnosis not present

## 2021-08-05 DIAGNOSIS — Z79890 Hormone replacement therapy (postmenopausal): Secondary | ICD-10-CM | POA: Diagnosis not present

## 2021-08-05 DIAGNOSIS — N950 Postmenopausal bleeding: Secondary | ICD-10-CM | POA: Diagnosis not present

## 2021-08-05 NOTE — Progress Notes (Signed)
   Acute Office Visit  Subjective:    Patient ID: Kimberly Hill, female    DOB: 03-May-1966, 55 y.o.   MRN: [id number]   HPI 55 y.o. presents today for vaginal bleeding. LMP prior to this episode was 08/2020. She was having significant menopausal symptoms when seen for her annual visit June 2022 and was started on HRT. Bleeding started in July and ranges from light to moderate. Occurs most days. Very minimal today. Menopausal symptoms have significantly improved since starting HRT.    Review of Systems  Constitutional: Negative.   Genitourinary:  Positive for vaginal bleeding.      Objective:    Physical Exam Constitutional:      Appearance: Normal appearance.  Genitourinary:    General: Normal vulva.     Vagina: Normal.     Cervix: Normal.     Uterus: Normal.     BP 122/76  Wt Readings from Last 3 Encounters:  04/11/21 170 lb (77.1 kg)  04/06/20 164 lb (74.4 kg)  03/26/19 162 lb (73.5 kg)        Assessment & Plan:   Problem List Items Addressed This Visit   None Visit Diagnoses     Abnormal uterine bleeding (AUB)    -  Primary   Relevant Orders   Follicle stimulating hormone   US PELVIS TRANSVAGINAL NON-OB (TV ONLY)   Hormone replacement therapy       Relevant Orders   US PELVIS TRANSVAGINAL NON-OB (TV ONLY)      Plan: Discussed HRT and risk for bleeding with use. [place] today. Provided her with the option to stop HRT and see if bleeding stops. She wants to continue since she feels much better being on them. We will schedule pelvic ultrasound. She is agreeable to plan.      Tamela Gammon DNP, 2:23 PM 08/05/2021

## 2021-08-06 LAB — FOLLICLE STIMULATING HORMONE: FSH: 120.9 m[IU]/mL — ABNORMAL HIGH

## 2021-09-07 DIAGNOSIS — Z86006 Personal history of melanoma in-situ: Secondary | ICD-10-CM | POA: Diagnosis not present

## 2021-09-07 DIAGNOSIS — D2262 Melanocytic nevi of left upper limb, including shoulder: Secondary | ICD-10-CM | POA: Diagnosis not present

## 2021-09-07 DIAGNOSIS — D225 Melanocytic nevi of trunk: Secondary | ICD-10-CM | POA: Diagnosis not present

## 2021-09-07 DIAGNOSIS — L821 Other seborrheic keratosis: Secondary | ICD-10-CM | POA: Diagnosis not present

## 2021-09-29 ENCOUNTER — Other Ambulatory Visit: Payer: BC Managed Care – PPO | Admitting: Obstetrics & Gynecology

## 2021-09-29 ENCOUNTER — Other Ambulatory Visit: Payer: BC Managed Care – PPO

## 2021-10-13 ENCOUNTER — Telehealth: Payer: Self-pay

## 2021-10-13 ENCOUNTER — Other Ambulatory Visit: Payer: Self-pay

## 2021-10-13 ENCOUNTER — Ambulatory Visit: Payer: BC Managed Care – PPO

## 2021-10-13 ENCOUNTER — Encounter: Payer: Self-pay | Admitting: Obstetrics & Gynecology

## 2021-10-13 ENCOUNTER — Ambulatory Visit (INDEPENDENT_AMBULATORY_CARE_PROVIDER_SITE_OTHER): Payer: BC Managed Care – PPO | Admitting: Obstetrics & Gynecology

## 2021-10-13 VITALS — BP 144/88 | HR 58

## 2021-10-13 DIAGNOSIS — Z79890 Hormone replacement therapy (postmenopausal): Secondary | ICD-10-CM

## 2021-10-13 DIAGNOSIS — N840 Polyp of corpus uteri: Secondary | ICD-10-CM

## 2021-10-13 DIAGNOSIS — N950 Postmenopausal bleeding: Secondary | ICD-10-CM | POA: Diagnosis not present

## 2021-10-13 DIAGNOSIS — N939 Abnormal uterine and vaginal bleeding, unspecified: Secondary | ICD-10-CM | POA: Diagnosis not present

## 2021-10-13 NOTE — Progress Notes (Signed)
Kimberly Hill May 18, 1966 [id number]        55 y.o.  G2P2L2   RP: PMB for Pelvic US  HPI: PMB x 02/2021.  [place] 120.9 on 08/05/2021.  Started on HRT x 04/2021 on Estradiol 0.5 mg PO daily and Prometrium 100 mg PO HS.  No pelvic pain.  No abnormal vaginal discharge.     OB History  Gravida Para Term Preterm AB Living  2 2     0 2  SAB IAB Ectopic Multiple Live Births      0        # Outcome Date GA Lbr Len/2nd Weight Sex Delivery Anes PTL Lv  2 Para           1 Para             Past medical history,surgical history, problem list, medications, allergies, family history and social history were all reviewed and documented in the EPIC chart.   Directed ROS with pertinent positives and negatives documented in the history of present illness/assessment and plan.  Exam:  Vitals:   10/13/21 1136  BP: (!) 144/88  Pulse: (!) 58  SpO2: 98%   General appearance:  Normal  Pelvic US today: T/V images.  Anteverted uterus normal in size and shape with a very small posterior intramural fibroid measured at 1 x 1.1 cm.  The overall uterine size is measured at 8.86 x 5.74 x 4.79 cm.  Thickened endometrial lining measured at 12.8 mm with a small avascular cystic area near the fundal portion and a probable endometrial polyp with increased vascularity with a feeder vessel noted also at the fundal portion of the uterus.  Both ovaries are atrophic in appearance.  No adnexal mass.  No free fluid in the pelvis.   Assessment/Plan:  55 y.o. G2P2L2   1. Postmenopausal bleeding PMB x 02/2021.  [place] 120.9 on 08/05/2021.  Started on HRT x 04/2021 on Estradiol 0.5 mg PO daily and Prometrium 100 mg PO HS.  No pelvic pain.  No abnormal vaginal discharge.  Pelvic ultrasound findings thoroughly reviewed with patient. Anteverted uterus normal in size and shape with a very small posterior intramural fibroid measured at 1 x 1.1 cm.  The overall uterine size is measured at 8.86 x 5.74 x 4.79 cm.  Thickened endometrial  lining measured at 12.8 mm with a small avascular cystic area near the fundal portion and a probable endometrial polyp with increased vascularity with a feeder vessel noted also at the fundal portion of the uterus.  Decision to proceed with hysteroscopy, MyoSure excision and dilation and curettage.  Information and hysteroscopy pamphlet given to patient.  Preop preparation, surgical procedure and risks, as well as postop precautions and expectations thoroughly reviewed.  2. Endometrial polyp Probable endometrial polyp with increased vascularity with a feeder vessel noted also at the fundal portion of the uterus.  Decision to proceed with hysteroscopy, MyoSure excision and dilation and curettage.  Information and hysteroscopy pamphlet given to patient.  Preop preparation, surgical procedure and risks, as well as postop precautions and expectations thoroughly reviewed.   3. Postmenopausal hormone replacement therapy  [place] 120.9 on 08/05/2021.  Started on HRT x 04/2021 on Estradiol 0.5 mg PO daily and Prometrium 100 mg PO HS.                          Patient was counseled as to the risk of surgery to include the following:  1. Infection (  prohylactic antibiotics will be administered)  2. DVT/Pulmonary Embolism (prophylactic pneumo compression stockings will be used)  3.Trauma to internal organs requiring additional surgical procedure to repair any injury to internal organs requiring perhaps additional hospitalization days.  4.Hemmorhage requiring transfusion and blood products which carry risks such as anaphylactic reaction, hepatitis and AIDS  Patient had received literature information on the procedure scheduled and all her questions were answered and fully accepts all risk.    [name] MD, 12:02 PM 10/13/2021

## 2021-10-13 NOTE — Telephone Encounter (Signed)
-----   Message from [name], MD sent at 10/13/2021 12:38 PM EST ----- Regarding: schedule surgery Surgery:  Hysteroscopy, Myosure Reach Excision, D+C  Diagnosis:  Postmenopausal bleeding/Endometrial Polyp  Location: [facility]  Status: Outpatient  Time: 30 Minutes  Assistant: N/A  Urgency: First Available  Pre-Op Appointment: Completed  Post-Op Appointment(s): 2 Weeks  Time Out Of Work: Day Of Surgery ONLY

## 2021-10-18 NOTE — Telephone Encounter (Signed)
Spoke with patient. Reviewed surgery dates. Patient request to proceed with surgery on 11/09/21.  Advised patient I will forward to business office for return call. I will return call once surgery date and time confirmed. Patient verbalizes understanding and is agreeable.   Surgery request sent.

## 2021-10-25 NOTE — Telephone Encounter (Signed)
Spoke with patient regarding surgery benefits. Patient acknowledges understanding of information presented. Patient is aware that benefits presented are professional benefits only. Patient is aware the hospital will call with facility benefits. See account note.  Patient has additional questions regarding when she needs to stop taking medications. Informed patient that Sharee Pimple would go over all of that when she returns call to confirm surgery date and time. Patient agreeable and appreciative.  Routing to Glorianne Manchester, Therapist, sports.

## 2021-10-25 NOTE — Telephone Encounter (Signed)
Spoke with patient. Surgery date request confirmed.  Advised surgery is scheduled for 11/09/21, [place], [facility].  Surgery instruction sheet and hospital brochure reviewed, printed copy will be mailed.  Patient advised if Covid screening and quarantine requirements and agreeable.   Routing to provider. Encounter closed.  Cc: Hayley Carder

## 2021-11-03 ENCOUNTER — Encounter (HOSPITAL_BASED_OUTPATIENT_CLINIC_OR_DEPARTMENT_OTHER): Payer: Self-pay | Admitting: Obstetrics & Gynecology

## 2021-11-03 ENCOUNTER — Other Ambulatory Visit: Payer: Self-pay

## 2021-11-03 NOTE — Progress Notes (Signed)
Spoke w/ via phone for pre-op interview--- pt Lab needs dos---- no (per anes)/  pre-op orders pending              Lab results------ no COVID test -----patient states asymptomatic no test needed Arrive at ------- 0730 on 11-09-2021 NPO after MN NO Solid Food.  Clear liquids from MN until--- 0630 Med rec completed Medications to take morning of surgery ----- estradiol, synthroid Diabetic medication ----- n/a Patient instructed no nail polish to be worn day of surgery Patient instructed to bring photo id and insurance card day of surgery Patient aware to have Driver (ride ) / caregiver  for 24 hours after surgery -- husband, Kimberly Hill Patient Special Instructions ----- n/a Pre-Op special Istructions ----- sent inbox message to dr Dellis Filbert in epic , requested orders Patient verbalized understanding of instructions that were given at this phone interview. Patient denies shortness of breath, chest pain, fever, cough at this phone interview.

## 2021-11-09 ENCOUNTER — Ambulatory Visit (HOSPITAL_BASED_OUTPATIENT_CLINIC_OR_DEPARTMENT_OTHER)
Admission: RE | Admit: 2021-11-09 | Discharge: 2021-11-09 | Disposition: A | Discharge status: Home / Self Care | Payer: BC Managed Care – PPO | Source: Ambulatory Visit | Attending: Obstetrics & Gynecology | Admitting: Obstetrics & Gynecology

## 2021-11-09 ENCOUNTER — Other Ambulatory Visit: Payer: Self-pay

## 2021-11-09 ENCOUNTER — Ambulatory Visit (HOSPITAL_BASED_OUTPATIENT_CLINIC_OR_DEPARTMENT_OTHER): Payer: BC Managed Care – PPO | Admitting: Anesthesiology

## 2021-11-09 ENCOUNTER — Encounter (HOSPITAL_BASED_OUTPATIENT_CLINIC_OR_DEPARTMENT_OTHER): Payer: Self-pay | Admitting: Obstetrics & Gynecology

## 2021-11-09 ENCOUNTER — Encounter (HOSPITAL_BASED_OUTPATIENT_CLINIC_OR_DEPARTMENT_OTHER)
Admission: RE | Disposition: A | Discharge status: Home / Self Care | Payer: Self-pay | Source: Ambulatory Visit | Attending: Obstetrics & Gynecology

## 2021-11-09 DIAGNOSIS — Z79890 Hormone replacement therapy (postmenopausal): Secondary | ICD-10-CM | POA: Diagnosis not present

## 2021-11-09 DIAGNOSIS — N950 Postmenopausal bleeding: Secondary | ICD-10-CM | POA: Diagnosis not present

## 2021-11-09 DIAGNOSIS — N840 Polyp of corpus uteri: Secondary | ICD-10-CM

## 2021-11-09 DIAGNOSIS — D251 Intramural leiomyoma of uterus: Secondary | ICD-10-CM | POA: Diagnosis not present

## 2021-11-09 DIAGNOSIS — Z87891 Personal history of nicotine dependence: Secondary | ICD-10-CM | POA: Insufficient documentation

## 2021-11-09 DIAGNOSIS — R9389 Abnormal findings on diagnostic imaging of other specified body structures: Secondary | ICD-10-CM | POA: Insufficient documentation

## 2021-11-09 DIAGNOSIS — E039 Hypothyroidism, unspecified: Secondary | ICD-10-CM | POA: Diagnosis not present

## 2021-11-09 DIAGNOSIS — Z01818 Encounter for other preprocedural examination: Secondary | ICD-10-CM

## 2021-11-09 DIAGNOSIS — D250 Submucous leiomyoma of uterus: Secondary | ICD-10-CM | POA: Insufficient documentation

## 2021-11-09 HISTORY — DX: Polyp of corpus uteri: N84.0

## 2021-11-09 HISTORY — DX: Presence of spectacles and contact lenses: Z97.3

## 2021-11-09 HISTORY — DX: Hypothyroidism, unspecified: E03.9

## 2021-11-09 HISTORY — DX: Presence of external hearing-aid: Z97.4

## 2021-11-09 HISTORY — DX: Postmenopausal bleeding: N95.0

## 2021-11-09 HISTORY — DX: Unspecified hearing loss, bilateral: H91.93

## 2021-11-09 HISTORY — DX: Nontoxic multinodular goiter: E04.2

## 2021-11-09 HISTORY — PX: DILATATION & CURETTAGE/HYSTEROSCOPY WITH MYOSURE: SHX6511

## 2021-11-09 LAB — CBC
HCT: 40.6 % (ref 36.0–46.0)
Hemoglobin: 13.6 g/dL (ref 12.0–15.0)
MCH: 30.6 pg (ref 26.0–34.0)
MCHC: 33.5 g/dL (ref 30.0–36.0)
MCV: 91.4 fL (ref 80.0–100.0)
Platelets: 182 10*3/uL (ref 150–400)
RBC: 4.44 MIL/uL (ref 3.87–5.11)
RDW: 13.2 % (ref 11.5–15.5)
WBC: 4.5 10*3/uL (ref 4.0–10.5)
nRBC: 0 % (ref 0.0–0.2)

## 2021-11-09 SURGERY — DILATATION & CURETTAGE/HYSTEROSCOPY WITH MYOSURE
Anesthesia: General | Site: Vagina

## 2021-11-09 MED ORDER — KETOROLAC TROMETHAMINE 30 MG/ML IJ SOLN
INTRAMUSCULAR | Status: DC | PRN
  Administered 2021-11-09: 30 mg via INTRAVENOUS

## 2021-11-09 MED ORDER — AMISULPRIDE (ANTIEMETIC) 5 MG/2ML IV SOLN: 10.0000 mg | Freq: Once | INTRAVENOUS | Status: DC | PRN

## 2021-11-09 MED ORDER — DEXAMETHASONE SODIUM PHOSPHATE 10 MG/ML IJ SOLN
INTRAMUSCULAR | Status: AC
  Filled 2021-11-09: qty 1

## 2021-11-09 MED ORDER — ONDANSETRON HCL 4 MG/2ML IJ SOLN
INTRAMUSCULAR | Status: AC
  Filled 2021-11-09: qty 2

## 2021-11-09 MED ORDER — OXYCODONE HCL 5 MG/5ML PO SOLN: 5.0000 mg | Freq: Once | ORAL | Status: DC | PRN

## 2021-11-09 MED ORDER — LIDOCAINE HCL (PF) 2 % IJ SOLN
INTRAMUSCULAR | Status: AC
  Filled 2021-11-09: qty 5

## 2021-11-09 MED ORDER — LIDOCAINE 2% (20 MG/ML) 5 ML SYRINGE
INTRAMUSCULAR | Status: DC | PRN
  Administered 2021-11-09: 60 mg via INTRAVENOUS

## 2021-11-09 MED ORDER — MIDAZOLAM HCL 2 MG/2ML IJ SOLN
INTRAMUSCULAR | Status: AC
  Filled 2021-11-09: qty 2

## 2021-11-09 MED ORDER — FENTANYL CITRATE (PF) 100 MCG/2ML IJ SOLN: 25.0000 ug | INTRAMUSCULAR | Status: DC | PRN

## 2021-11-09 MED ORDER — SILVER NITRATE-POT NITRATE 75-25 % EX MISC
CUTANEOUS | Status: DC | PRN
  Administered 2021-11-09: 2

## 2021-11-09 MED ORDER — LACTATED RINGERS IV SOLN: INTRAVENOUS | Status: DC

## 2021-11-09 MED ORDER — PROPOFOL 10 MG/ML IV BOLUS
INTRAVENOUS | Status: DC | PRN
Start: 2021-11-09 — End: 2021-11-09
  Administered 2021-11-09: 150 mg via INTRAVENOUS

## 2021-11-09 MED ORDER — ONDANSETRON HCL 4 MG/2ML IJ SOLN
INTRAMUSCULAR | Status: DC | PRN
Start: 2021-11-09 — End: 2021-11-09
  Administered 2021-11-09: 4 mg via INTRAVENOUS

## 2021-11-09 MED ORDER — SODIUM CHLORIDE 0.9 % IR SOLN
Status: DC | PRN
  Administered 2021-11-09: 3000 mL

## 2021-11-09 MED ORDER — FENTANYL CITRATE (PF) 100 MCG/2ML IJ SOLN
INTRAMUSCULAR | Status: DC | PRN
  Administered 2021-11-09 (×2): 50 ug via INTRAVENOUS

## 2021-11-09 MED ORDER — CEFAZOLIN SODIUM-DEXTROSE 2-4 GM/100ML-% IV SOLN
INTRAVENOUS | Status: AC
  Filled 2021-11-09: qty 100

## 2021-11-09 MED ORDER — OXYCODONE HCL 5 MG PO TABS: 5.0000 mg | ORAL_TABLET | Freq: Once | ORAL | Status: DC | PRN

## 2021-11-09 MED ORDER — CHLOROPROCAINE HCL 1 % IJ SOLN
INTRAMUSCULAR | Status: DC | PRN
Start: 2021-11-09 — End: 2021-11-09
  Administered 2021-11-09: 20 mL

## 2021-11-09 MED ORDER — FENTANYL CITRATE (PF) 100 MCG/2ML IJ SOLN
INTRAMUSCULAR | Status: AC
  Filled 2021-11-09: qty 2

## 2021-11-09 MED ORDER — POVIDONE-IODINE 10 % EX SWAB: 2.0000 "application " | Freq: Once | CUTANEOUS | Status: DC

## 2021-11-09 MED ORDER — ACETAMINOPHEN 10 MG/ML IV SOLN: 1000.0000 mg | Freq: Once | INTRAVENOUS | Status: DC | PRN

## 2021-11-09 MED ORDER — CEFAZOLIN SODIUM-DEXTROSE 2-4 GM/100ML-% IV SOLN
2.0000 g | INTRAVENOUS | Status: AC
  Administered 2021-11-09: 2 g via INTRAVENOUS

## 2021-11-09 MED ORDER — KETOROLAC TROMETHAMINE 30 MG/ML IJ SOLN: 30.0000 mg | Freq: Once | INTRAMUSCULAR | Status: DC

## 2021-11-09 MED ORDER — PROMETHAZINE HCL 25 MG/ML IJ SOLN: 6.2500 mg | INTRAMUSCULAR | Status: DC | PRN

## 2021-11-09 MED ORDER — KETOROLAC TROMETHAMINE 30 MG/ML IJ SOLN
INTRAMUSCULAR | Status: AC
  Filled 2021-11-09: qty 2

## 2021-11-09 MED ORDER — DEXAMETHASONE SODIUM PHOSPHATE 10 MG/ML IJ SOLN
INTRAMUSCULAR | Status: DC | PRN
  Administered 2021-11-09: 10 mg via INTRAVENOUS

## 2021-11-09 MED ORDER — MIDAZOLAM HCL 5 MG/5ML IJ SOLN
INTRAMUSCULAR | Status: DC | PRN
  Administered 2021-11-09: 2 mg via INTRAVENOUS

## 2021-11-09 SURGICAL SUPPLY — 22 items
CATH ROBINSON RED A/P 16FR (CATHETERS) ×1 IMPLANT
DEVICE MYOSURE LITE (MISCELLANEOUS) IMPLANT
DEVICE MYOSURE REACH (MISCELLANEOUS) ×1 IMPLANT
DILATOR CANAL MILEX (MISCELLANEOUS) IMPLANT
DRSG TELFA 3X8 NADH (GAUZE/BANDAGES/DRESSINGS) ×2 IMPLANT
ELECT REM PT RETURN 9FT ADLT (ELECTROSURGICAL)
ELECTRODE REM PT RTRN 9FT ADLT (ELECTROSURGICAL) IMPLANT
GAUZE 4X4 16PLY ~~LOC~~+RFID DBL (SPONGE) ×3 IMPLANT
GLOVE SURG ENC MOIS LTX SZ6.5 (GLOVE) ×2 IMPLANT
GLOVE SURG UNDER POLY LF SZ7 (GLOVE) ×4 IMPLANT
GOWN STRL REUS W/TWL LRG LVL3 (GOWN DISPOSABLE) ×4 IMPLANT
IV NS IRRIG 3000ML ARTHROMATIC (IV SOLUTION) ×3 IMPLANT
KIT PROCEDURE FLUENT (KITS) ×2 IMPLANT
KIT TURNOVER CYSTO (KITS) ×2 IMPLANT
MYOSURE XL FIBROID (MISCELLANEOUS)
PACK VAGINAL MINOR WOMEN LF (CUSTOM PROCEDURE TRAY) ×2 IMPLANT
PAD DRESSING TELFA 3X8 NADH (GAUZE/BANDAGES/DRESSINGS) ×1 IMPLANT
PAD OB MATERNITY 4.3X12.25 (PERSONAL CARE ITEMS) ×2 IMPLANT
PAD PREP 24X48 CUFFED NSTRL (MISCELLANEOUS) ×2 IMPLANT
SEAL CERVICAL OMNI LOK (ABLATOR) IMPLANT
SEAL ROD LENS SCOPE MYOSURE (ABLATOR) ×2 IMPLANT
SYSTEM TISS REMOVAL MYOSURE XL (MISCELLANEOUS) IMPLANT

## 2021-11-09 NOTE — Anesthesia Postprocedure Evaluation (Signed)
Anesthesia Post Note  Patient: Kimberly Hill  Procedure(s) Performed: DILATATION & CURETTAGE/HYSTEROSCOPY WITH MYOSURE Reach excision (Vagina )     Patient location during evaluation: PACU Anesthesia Type: General Level of consciousness: awake Pain management: pain level controlled Vital Signs Assessment: post-procedure vital signs reviewed and stable Respiratory status: spontaneous breathing, nonlabored ventilation, respiratory function stable and patient connected to nasal cannula oxygen Cardiovascular status: blood pressure returned to baseline and stable Postop Assessment: no apparent nausea or vomiting Anesthetic complications: no   No notable events documented.  Last Vitals:  Vitals:   11/09/21 1130 11/09/21 1155  BP: 117/77 123/83  Pulse: (!) 57 (!) 57  Resp: 18 19  Temp: (!) 36.3 C   SpO2: 99% 97%    Last Pain:  Vitals:   11/09/21 1130  TempSrc:   PainSc: 2                  Sherrie Marsan P Jacqulene Huntley

## 2021-11-09 NOTE — Anesthesia Procedure Notes (Signed)
Procedure Name: LMA Insertion Date/Time: 11/09/2021 9:58 AM Performed by: Genelle Bal, CRNA Pre-anesthesia Checklist: Patient identified, Emergency Drugs available, Suction available and Patient being monitored Patient Re-evaluated:Patient Re-evaluated prior to induction Oxygen Delivery Method: Circle system utilized Preoxygenation: Pre-oxygenation with 100% oxygen Induction Type: IV induction Ventilation: Mask ventilation without difficulty LMA: LMA inserted LMA Size: 4.0 Number of attempts: 1 Airway Equipment and Method: Bite block Placement Confirmation: positive ETCO2 Tube secured with: Tape Dental Injury: Teeth and Oropharynx as per pre-operative assessment

## 2021-11-09 NOTE — Discharge Instructions (Signed)
°  Post Anesthesia Home Care Instructions  Activity: Get plenty of rest for the remainder of the day. A responsible individual must stay with you for 24 hours following the procedure.  For the next 24 hours, DO NOT: -Drive a car -Paediatric nurse -Drink alcoholic beverages -Take any medication unless instructed by your physician -Make any legal decisions or sign important papers.  Meals: Start with liquid foods such as gelatin or soup. Progress to regular foods as tolerated. Avoid greasy, spicy, heavy foods. If nausea and/or vomiting occur, drink only clear liquids until the nausea and/or vomiting subsides. Call your physician if vomiting continues.  Special Instructions/Symptoms: Your throat may feel dry or sore from the anesthesia or the breathing tube placed in your throat during surgery. If this causes discomfort, gargle with warm salt water. The discomfort should disappear within 24 hours.  If you had a scopolamine patch placed behind your ear for the management of post- operative nausea and/or vomiting:  1. The medication in the patch is effective for 72 hours, after which it should be removed.  Wrap patch in a tissue and discard in the trash. Wash hands thoroughly with soap and water. 2. You may remove the patch earlier than 72 hours if you experience unpleasant side effects which may include dry mouth, dizziness or visual disturbances. 3. Avoid touching the patch. Wash your hands with soap and water after contact with the patch.    No ibuprofen, Advil, Aleve, Motrin, ketorolac, meloxicam, naproxen, or other NSAIDS until after 4:30 pm today if needed.  DISCHARGE INSTRUCTIONS: D&C The following instructions have been prepared to help you care for yourself upon your return home.   Personal hygiene:  Use sanitary pads for vaginal drainage, not tampons.  Shower the day after your procedure.  NO tub baths, pools or Jacuzzis for 2-3 weeks.  Wipe front to back after using the  bathroom.  Activity and limitations:  Do NOT drive or operate any equipment for 24 hours. The effects of anesthesia are still present and drowsiness may result.  Do NOT rest in bed all day.  Walking is encouraged.  Walk up and down stairs slowly.  You may resume your normal activity in one to two days or as indicated by your physician.  Sexual activity: NO intercourse for at least 2 weeks after the procedure, or as indicated by your physician.  Diet: Eat a light meal as desired this evening. You may resume your usual diet tomorrow.  Return to work: You may resume your work activities in one to two days or as indicated by your doctor.  What to expect after your surgery: Expect to have vaginal bleeding/discharge for 2-3 days and spotting for up to 10 days. It is not unusual to have soreness for up to 1-2 weeks. You may have a slight burning sensation when you urinate for the first day. Mild cramps may continue for a couple of days. You may have a regular period in 2-6 weeks.  Call your doctor for any of the following:  Excessive vaginal bleeding, saturating and changing one pad every hour.  Inability to urinate 6 hours after discharge from hospital.  Pain not relieved by pain medication.  Fever of 100.4 F or greater.  Unusual vaginal discharge or odor.   Call for an appointment:

## 2021-11-09 NOTE — Op Note (Signed)
Operative Note  11/09/2021  10:33 AM  PATIENT:  Kimberly Hill  56 y.o. female  PRE-OPERATIVE DIAGNOSIS:  Postmenopausal bleeding, endometrial polyps  POST-OPERATIVE DIAGNOSIS:  Postmenopausal bleeding, endometrial polyps and submucosal fibroids  PROCEDURE:  Procedure(s): DILATATION & CURETTAGE/HYSTEROSCOPY WITH MYOSURE Reach Excisions  SURGEON:  Surgeon(s): [name], MD  ANESTHESIA:   general with Laryngeal Mask  FINDINGS: Many Endometrial Polyps and 2 Submucosal Fibroids.  Both Ostia well seen.  DESCRIPTION OF OPERATION: Under general anesthesia with laryngeal mask, the patient is in lithotomy position.  She is prepped with Betadine on the suprapubic, vulvar and vaginal areas.  She is draped as usual.  The bladder is catheterized.  Timeout is done.  The gynecologic exam reveals an anteverted uterus overall normal volume.  No adnexal mass.  The speculum is inserted in the vagina and the anterior lip of the cervix is grasped with a tenaculum.  A paracervical block is done with Nesacaine 1% a total of 20 cc at 4 and 8:00.  Dilation of the cervix with Pratt dilators up to #23 without difficulty.  The hysteroscope was inserted in the intra uterine cavity and inspection is done.  A submucosal fibroid is present at the posterior right side with a few polyps around 8.  A second submucosal fibroid is present at the left fundal aspect of the cavity.  The MyoSure reach is inserted in the intrauterine cavity through the hysteroscope.  Excision of the 2 submucosal fibroids as well as all the polyps is completed.  Both ostia are well seen after excisions.  Pictures were taken before and after excisions.  Hemostasis is adequate.  The hysteroscope with MyoSure are removed.  We will proceed with a systematic curettage of the intrauterine cavity on all surfaces with a sharp curette.  The curette is then removed.  The endometrial curettings as well as the excision material are sent together to  pathology.  The tenaculum was removed from the cervix.  Hemostasis is completed at that level with a silver nitrate stick.  The speculum is removed.  The patient is brought to recovery room in good and stable status.  ESTIMATED BLOOD LOSS: 5 mL Fluid Deficit: 300 mL  Intake/Output Summary (Last 24 hours) at 11/09/2021 1033 Last data filed at 11/09/2021 1030 Gross per 24 hour  Intake 1000 ml  Output 5 ml  Net 995 ml     BLOOD ADMINISTERED:none   LOCAL MEDICATIONS USED:  Nesacaine 1% 20 cc for Paracervical Block  SPECIMEN:  Source of Specimen:  Excision material from Endometrial Polyps and Submucosal Fibroids, Endometrial Curettings  DISPOSITION OF SPECIMEN:  PATHOLOGY  COUNTS:  YES  PLAN OF CARE: Transfer to PACU  Marie-Lyne LavoieMD10:33 AM

## 2021-11-09 NOTE — Anesthesia Preprocedure Evaluation (Addendum)
Anesthesia Evaluation  Patient identified by MRN, date of birth, ID band Patient awake    Reviewed: Allergy & Precautions, NPO status , Patient's Chart, lab work & pertinent test results  Airway Mallampati: II  TM Distance: >3 FB Neck ROM: Full    Dental no notable dental hx.    Pulmonary former smoker,    Pulmonary exam normal breath sounds clear to auscultation       Cardiovascular negative cardio ROS Normal cardiovascular exam Rhythm:Regular Rate:Normal     Neuro/Psych negative neurological ROS  negative psych ROS   GI/Hepatic negative GI ROS, Neg liver ROS,   Endo/Other  Hypothyroidism   Renal/GU negative Renal ROS     Musculoskeletal negative musculoskeletal ROS (+)   Abdominal (+) + obese,   Peds  Hematology negative hematology ROS (+)   Anesthesia Other Findings postmenopausal bleeding endometrial polyps  Reproductive/Obstetrics                            Anesthesia Physical Anesthesia Plan  ASA: 2  Anesthesia Plan: General   Post-op Pain Management:    Induction: Intravenous  PONV Risk Score and Plan: 4 or greater and Ondansetron, Dexamethasone, Midazolam and Treatment may vary due to age or medical condition  Airway Management Planned: LMA  Additional Equipment:   Intra-op Plan:   Post-operative Plan: Extubation in OR  Informed Consent: I have reviewed the patients History and Physical, chart, labs and discussed the procedure including the risks, benefits and alternatives for the proposed anesthesia with the patient or authorized representative who has indicated his/her understanding and acceptance.     Dental advisory given  Plan Discussed with: CRNA  Anesthesia Plan Comments:        Anesthesia Quick Evaluation

## 2021-11-09 NOTE — Transfer of Care (Signed)
Immediate Anesthesia Transfer of Care Note  Patient: Kimberly Hill  Procedure(s) Performed: DILATATION & CURETTAGE/HYSTEROSCOPY WITH MYOSURE Reach excision (Vagina )  Patient Location: PACU  Anesthesia Type:General  Level of Consciousness: awake, alert  and oriented  Airway & Oxygen Therapy: Patient Spontanous Breathing and Patient connected to face mask oxygen  Post-op Assessment: Report given to RN and Post -op Vital signs reviewed and stable  Post vital signs: Reviewed and stable  Last Vitals:  Vitals Value Taken Time  BP 103/64 11/09/21 1043  Temp    Pulse 57 11/09/21 1045  Resp 13 11/09/21 1045  SpO2 100 % 11/09/21 1045  Vitals shown include unvalidated device data.  Last Pain:  Vitals:   11/09/21 0739  TempSrc: Oral         Complications: No notable events documented.

## 2021-11-09 NOTE — H&P (Signed)
Kimberly Hill is an 57 y.o. female.     56 y.o.  G2P2L2    RP: Hysteroscopy with Myosure Excision and Dilation and Curettage   HPI: PMB x 02/2021.  [place] 120.9 on 08/05/2021.  Started on HRT x 04/2021 on Estradiol 0.5 mg PO daily and Prometrium 100 mg PO HS. No pelvic pain.  No abnormal vaginal discharge.  Pelvic US on 10/13/2021 showed a thickened endometrium with probable Polyp.  Pertinent Gynecological History: Menses: post-menopausal/PMB Contraception: post menopausal status Blood transfusions: none Sexually transmitted diseases: no past history Last mammogram: normal  Last pap: normal     Menstrual History: Patient's last menstrual period was 08/23/2020.    Past Medical History:  Diagnosis Date   Deafness, bilateral    loss of  50% of hearing   Endometrial polyp    Hypothyroidism    followed by pcp and gyn   Multinodular goiter    hx bilateral nodule bx's in 5053 , follicular epithelium   PMB (postmenopausal bleeding)    Wears glasses    Wears hearing aid in both ears     Past Surgical History:  Procedure Laterality Date   HYSTEROSCOPY WITH D & C  09/06/2006   @[facility] ;  polypectomy   KNEE ARTHROSCOPY Left 2009    Family History  Problem Relation Age of Onset   Hypertension Father    Colon cancer Neg Hx    Colon polyps Neg Hx    Rectal cancer Neg Hx    Stomach cancer Neg Hx    Esophageal cancer Neg Hx     Social History:  reports that she quit smoking about 33 years ago. Her smoking use included cigarettes. She has never used smokeless tobacco. She reports that she does not drink alcohol and does not use drugs.  Allergies: No Known Allergies  Medications Prior to Admission  Medication Sig Dispense Refill Last Dose   estradiol (ESTRACE) 0.5 MG tablet TAKE 1 TABLET BY MOUTH EVERY DAY (Patient taking differently: Take 0.5 mg by mouth daily.) 90 tablet 4 11/09/2021 at 0600   levothyroxine (SYNTHROID) 100 MCG tablet Take 1 tablet (100 mcg total) by mouth daily  before breakfast. (Patient taking differently: Take 100 mcg by mouth daily before breakfast.) 90 tablet 3 11/09/2021 at 0600   progesterone (PROMETRIUM) 100 MG capsule Take 1 capsule (100 mg total) by mouth daily. (Patient taking differently: Take 100 mg by mouth at bedtime.) 30 capsule 11 11/08/2021   VITAMIN D PO Take by mouth daily.   Unknown    REVIEW OF SYSTEMS: A ROS was performed and pertinent positives and negatives are included in the history.  GENERAL: No fevers or chills. HEENT: No change in vision, no earache, sore throat or sinus congestion. NECK: No pain or stiffness. CARDIOVASCULAR: No chest pain or pressure. No palpitations. PULMONARY: No shortness of breath, cough or wheeze. GASTROINTESTINAL: No abdominal pain, nausea, vomiting or diarrhea, melena or bright red blood per rectum. GENITOURINARY: No urinary frequency, urgency, hesitancy or dysuria. MUSCULOSKELETAL: No joint or muscle pain, no back pain, no recent trauma. DERMATOLOGIC: No rash, no itching, no lesions. ENDOCRINE: No polyuria, polydipsia, no heat or cold intolerance. No recent change in weight. HEMATOLOGICAL: No anemia or easy bruising or bleeding. NEUROLOGIC: No headache, seizures, numbness, tingling or weakness. PSYCHIATRIC: No depression, no loss of interest in normal activity or change in sleep pattern.     Blood pressure 121/67, pulse (!) 58, temperature 98 F (36.7 C), temperature source Oral, resp. rate 16, height  5' 2.5" (1.588 m), weight 79.2 kg, last menstrual period 08/23/2020, SpO2 97 %.  Physical Exam:  Results for orders placed or performed during the hospital encounter of 11/09/21 (from the past 24 hour(s))  CBC     Status: None   Collection Time: 11/09/21  8:00 AM  Result Value Ref Range   WBC 4.5 4.0 - 10.5 K/uL   RBC 4.44 3.87 - 5.11 MIL/uL   Hemoglobin 13.6 12.0 - 15.0 g/dL   HCT 40.6 36.0 - 46.0 %   MCV 91.4 80.0 - 100.0 fL   MCH 30.6 26.0 - 34.0 pg   MCHC 33.5 30.0 - 36.0 g/dL   RDW 13.2 11.5 -  15.5 %   Platelets 182 150 - 400 K/uL   nRBC 0.0 0.0 - 0.2 %   Pelvic US 12.22.2022: T/V images.  Anteverted uterus normal in size and shape with a very small posterior intramural fibroid measured at 1 x 1.1 cm.  The overall uterine size is measured at 8.86 x 5.74 x 4.79 cm.  Thickened endometrial lining measured at 12.8 mm with a small avascular cystic area near the fundal portion and a probable endometrial polyp with increased vascularity with a feeder vessel noted also at the fundal portion of the uterus.  Both ovaries are atrophic in appearance.  No adnexal mass.  No free fluid in the pelvis.     Assessment/Plan:  57 y.o. G2P2L2    1. Postmenopausal bleeding PMB x 02/2021.  [place] 120.9 on 08/05/2021.  Started on HRT x 04/2021 on Estradiol 0.5 mg PO daily and Prometrium 100 mg PO HS.  No pelvic pain.  No abnormal vaginal discharge.  Pelvic ultrasound findings thoroughly reviewed with patient. Anteverted uterus normal in size and shape with a very small posterior intramural fibroid measured at 1 x 1.1 cm.  The overall uterine size is measured at 8.86 x 5.74 x 4.79 cm. Thickened endometrial lining measured at 12.8 mm with a small avascular cystic area near the fundal portion and a probable endometrial polyp with increased vascularity with a feeder vessel noted also at the fundal portion of the uterus.  Decision to proceed with hysteroscopy, MyoSure excision and dilation and curettage.  Information and hysteroscopy pamphlet given to patient.  Preop preparation, surgical procedure and risks, as well as postop precautions and expectations thoroughly reviewed.   2. Endometrial polyp Probable endometrial polyp with increased vascularity with a feeder vessel noted also at the fundal portion of the uterus.  Decision to proceed with hysteroscopy, MyoSure excision and dilation and curettage.  Information and hysteroscopy pamphlet given to patient.  Preop preparation, surgical procedure and risks, as well as postop  precautions and expectations thoroughly reviewed.     3. Postmenopausal hormone replacement therapy  [place] 120.9 on 08/05/2021.  Started on HRT x 04/2021 on Estradiol 0.5 mg PO daily and Prometrium 100 mg PO HS.                           Patient was counseled as to the risk of surgery to include the following:   1. Infection (prohylactic antibiotics will be administered)   2. DVT/Pulmonary Embolism (prophylactic pneumo compression stockings will be used)   3.Trauma to internal organs requiring additional surgical procedure to repair any injury to internal organs requiring perhaps additional hospitalization days.   4.Hemmorhage requiring transfusion and blood products which carry risks such as anaphylactic reaction, hepatitis and AIDS   Patient had received  literature information on the procedure scheduled and all her questions were answered and fully accepts all risk.    Kimberly Hill 11/09/2021, 8:42 AM

## 2021-11-10 ENCOUNTER — Encounter (HOSPITAL_BASED_OUTPATIENT_CLINIC_OR_DEPARTMENT_OTHER): Payer: Self-pay | Admitting: Obstetrics & Gynecology

## 2021-11-10 LAB — SURGICAL PATHOLOGY

## 2021-11-21 ENCOUNTER — Ambulatory Visit (INDEPENDENT_AMBULATORY_CARE_PROVIDER_SITE_OTHER): Payer: BC Managed Care – PPO | Admitting: Obstetrics & Gynecology

## 2021-11-21 ENCOUNTER — Encounter: Payer: Self-pay | Admitting: Obstetrics & Gynecology

## 2021-11-21 ENCOUNTER — Other Ambulatory Visit: Payer: Self-pay

## 2021-11-21 VITALS — BP 120/82 | HR 72 | Ht 62.0 in | Wt 169.8 lb

## 2021-11-21 DIAGNOSIS — N9489 Other specified conditions associated with female genital organs and menstrual cycle: Secondary | ICD-10-CM | POA: Diagnosis not present

## 2021-11-21 DIAGNOSIS — Z09 Encounter for follow-up examination after completed treatment for conditions other than malignant neoplasm: Secondary | ICD-10-CM | POA: Diagnosis not present

## 2021-11-21 LAB — WET PREP FOR TRICH, YEAST, CLUE

## 2021-11-21 MED ORDER — FLUCONAZOLE 150 MG PO TABS: 150.0000 mg | ORAL_TABLET | Freq: Once | ORAL | 0 refills | Status: AC

## 2021-11-21 MED ORDER — TINIDAZOLE 500 MG PO TABS: 1000.0000 mg | ORAL_TABLET | Freq: Two times a day (BID) | ORAL | 0 refills | Status: AC

## 2021-11-21 NOTE — Progress Notes (Signed)
° ° °  Kimberly Hill 1966-09-06 [id number]        56 y.o.  G2P0002   RP: Vulvar burning/Postop HSC, D+C, Myosure  HPI: C/O mild vulvar burning.  No vaginal bleeding.  Mild vaginal d/c.  No pelvic pain.  No UTI Sx.  BMs normal.  No fever.   OB History  Gravida Para Term Preterm AB Living  2 2     0 2  SAB IAB Ectopic Multiple Live Births      0        # Outcome Date GA Lbr Len/2nd Weight Sex Delivery Anes PTL Lv  2 Para           1 Para             Past medical history,surgical history, problem list, medications, allergies, family history and social history were all reviewed and documented in the EPIC chart.   Directed ROS with pertinent positives and negatives documented in the history of present illness/assessment and plan.  Exam:  Vitals:   11/21/21 1249  BP: 120/82  Pulse: 72  SpO2: 99%  Weight: 169 lb 12.8 oz (77 kg)  Height: 5\' 2"  (1.575 m)   General appearance:  Normal  Abdomen: Normal  Gynecologic exam: Vulva with mild irritation/discharge.  Speculum:  Cervix/Vagina normal.  Mild liquid d/c.  Wet prep done.  Patho 11/09/2021:  FINAL MICROSCOPIC DIAGNOSIS:   A. ENDOMETRIUM, FIBROID AND POLYP, CURETTAGE:  - Benign endometrial polyp  - Scant fragments of benign smooth muscle  - Negative for hyperplasia or malignancy   Wet prep:  Clue cells present with odor.   Assessment/Plan:  56 y.o. G2P0002   1. Status post gynecological surgery, follow-up exam C/O mild vulvar burning.  No vaginal bleeding.  Mild vaginal d/c.  No pelvic pain.  No UTI Sx.  BMs normal.  No fever.Excellent postop healing.  No Cx.  Patho benign.  Patient reassured.  Can resume all activities.  F/U when due for Annual-Gyn exam.  2. Vulvar burning Bacterial vaginosis confirmed by wet prep.  Will treat with Tinidazole.  Usage reviewed and prescription sent to pharmacy.  Fluconazole 1 tab post ABTx to treat or prevent a yeast vaginitis. - WET PREP FOR [place], YEAST, CLUE  Other orders -  tinidazole (TINDAMAX) 500 MG tablet; Take 2 tablets (1,000 mg total) by mouth 2 (two) times daily for 2 days. - fluconazole (DIFLUCAN) 150 MG tablet; Take 1 tablet (150 mg total) by mouth once for 1 dose. Take after finishing the antibiotics.   [name] MD, 12:58 PM 11/21/2021

## 2021-11-22 ENCOUNTER — Encounter: Payer: Self-pay | Admitting: Obstetrics & Gynecology

## 2022-03-17 DIAGNOSIS — R112 Nausea with vomiting, unspecified: Secondary | ICD-10-CM | POA: Diagnosis not present

## 2022-03-17 DIAGNOSIS — R42 Dizziness and giddiness: Secondary | ICD-10-CM | POA: Diagnosis not present

## 2022-03-17 DIAGNOSIS — Z20822 Contact with and (suspected) exposure to covid-19: Secondary | ICD-10-CM | POA: Diagnosis not present

## 2022-04-02 ENCOUNTER — Other Ambulatory Visit: Payer: Self-pay | Admitting: Nurse Practitioner

## 2022-04-02 DIAGNOSIS — N951 Menopausal and female climacteric states: Secondary | ICD-10-CM

## 2022-04-02 DIAGNOSIS — E039 Hypothyroidism, unspecified: Secondary | ICD-10-CM

## 2022-04-02 DIAGNOSIS — Z79890 Hormone replacement therapy (postmenopausal): Secondary | ICD-10-CM

## 2022-04-03 NOTE — Telephone Encounter (Signed)
Annual exam scheduled on 05/01/22 Last annual exam was 03/2021

## 2022-04-10 ENCOUNTER — Other Ambulatory Visit: Payer: Self-pay | Admitting: Nurse Practitioner

## 2022-04-10 DIAGNOSIS — Z1231 Encounter for screening mammogram for malignant neoplasm of breast: Secondary | ICD-10-CM

## 2022-04-30 NOTE — Progress Notes (Unsigned)
Kimberly Hill [birth year] [id number]   History:  56 y.o. G2P0002 presents for annual exam. Postmenopausal - on HRT. 10/2021 benign endometrial polyp, no bleeding since. Normal pap and mammogram history. H/O hypothyroidism, Vitamin D deficiency.   Gynecologic History Patient's last menstrual period was 08/23/2020.   Contraception/Family planning: post menopausal status Sexually active: Yes  Health Maintenance Last Pap: 03/26/2019. Results were: Normal, 5-year repeat Last mammogram: 05/23/2021. Results were: Normal Last colonoscopy: 08/03/2016. Results were: Benign polyp x 1, 10-year recall Last Dexa: Not indicated  Past medical history, past surgical history, family history and social history were all reviewed and documented in the EPIC chart. Married. Daughter and son in [place]. Works at [employer].   ROS:  A ROS was performed and pertinent positives and negatives are included.  Exam:  Vitals:   05/01/22 0806  BP: 118/80  Weight: 148 lb (67.1 kg)  Height: '5\' 2"'$  (1.575 m)    Body mass index is 27.07 kg/m.  General appearance:  Normal Thyroid:  Symmetrical, normal in size, without palpable masses or nodularity. Respiratory  Auscultation:  Clear without wheezing or rhonchi Cardiovascular  Auscultation:  Regular rate, without rubs, murmurs or gallops  Edema/varicosities:  Not grossly evident Abdominal  Soft,nontender, without masses, guarding or rebound.  Liver/spleen:  No organomegaly noted  Hernia:  None appreciated  Skin  Inspection:  Grossly normal Breasts: Examined lying and sitting.   Right: Without masses, retractions, nipple discharge or axillary adenopathy.   Left: Without masses, retractions, nipple discharge or axillary adenopathy. Genitourinary   Inguinal/mons:  Normal without inguinal adenopathy  External genitalia:  Normal appearing vulva with no masses, tenderness, or lesions  BUS/Urethra/Skene's glands:  Normal  Vagina:  Normal appearing with normal color and  discharge, no lesions  Cervix:  Normal appearing without discharge or lesions  Uterus:  Normal in size, shape and contour.  Midline and mobile, nontender  Adnexa/parametria:     Rt: Normal in size, without masses or tenderness.   Lt: Normal in size, without masses or tenderness.  Anus and perineum: Normal  Digital rectal exam: Normal sphincter tone without palpated masses or tenderness  Patient informed chaperone available to be present for breast and pelvic exam. Patient has requested no chaperone to be present. Patient has been advised what will be completed during breast and pelvic exam.   Assessment/Plan:  56 y.o. G2P0002 for annual exam.   Well female exam with routine gynecological exam - Plan: CBC with Differential/Platelet, Comprehensive metabolic panel. Education provided on SBEs, importance of preventative screenings, current guidelines, high calcium diet, regular exercise, and multivitamin daily.   Postmenopausal hormone therapy - Plan: estradiol (ESTRACE) 0.5 MG tablet daily, progesterone (PROMETRIUM) 100 MG capsule nightly. Doing well on this and wants to continue. Refill x 1 year provided.   Vitamin D deficiency - Plan: VITAMIN D 25 Hydroxy (Vit-D Deficiency, Fractures)  Hyperlipidemia, unspecified hyperlipidemia type - Plan: Lipid panel  Hypothyroidism, unspecified type - Plan: TSH. Currently taking Synthroid 100 mcg daily. Reports that she has been taking with Estradiol. Recommend taking 1 hour apart.   Screening for cervical cancer - Normal Pap history.  Will repeat at 5-year interval per guidelines.  Screening for breast cancer - Normal mammogram history.  Continue annual screenings.  Normal breast exam today.  Screening for colon cancer - 2017 colonoscopy. Will repeat at GI's recommended interval.   Screening for osteoporosis - Average risk. Will plan DXA at age 33.   Return in 1 year for annual.  Tamela Gammon DNP, 8:27 AM 05/01/2022

## 2022-05-01 ENCOUNTER — Ambulatory Visit (INDEPENDENT_AMBULATORY_CARE_PROVIDER_SITE_OTHER): Payer: BC Managed Care – PPO | Admitting: Nurse Practitioner

## 2022-05-01 ENCOUNTER — Encounter: Payer: Self-pay | Admitting: Nurse Practitioner

## 2022-05-01 VITALS — BP 118/80 | Ht 62.0 in | Wt 148.0 lb

## 2022-05-01 DIAGNOSIS — E785 Hyperlipidemia, unspecified: Secondary | ICD-10-CM

## 2022-05-01 DIAGNOSIS — E039 Hypothyroidism, unspecified: Secondary | ICD-10-CM

## 2022-05-01 DIAGNOSIS — Z01419 Encounter for gynecological examination (general) (routine) without abnormal findings: Secondary | ICD-10-CM | POA: Diagnosis not present

## 2022-05-01 DIAGNOSIS — Z79890 Hormone replacement therapy (postmenopausal): Secondary | ICD-10-CM | POA: Diagnosis not present

## 2022-05-01 DIAGNOSIS — E559 Vitamin D deficiency, unspecified: Secondary | ICD-10-CM

## 2022-05-01 MED ORDER — ESTRADIOL 0.5 MG PO TABS: 0.5000 mg | ORAL_TABLET | Freq: Every day | ORAL | 3 refills | Status: DC

## 2022-05-01 MED ORDER — PROGESTERONE MICRONIZED 100 MG PO CAPS: 100.0000 mg | ORAL_CAPSULE | Freq: Every evening | ORAL | 3 refills | Status: DC

## 2022-05-02 ENCOUNTER — Other Ambulatory Visit: Payer: Self-pay | Admitting: Nurse Practitioner

## 2022-05-02 DIAGNOSIS — E039 Hypothyroidism, unspecified: Secondary | ICD-10-CM

## 2022-05-02 LAB — CBC WITH DIFFERENTIAL/PLATELET
Absolute Monocytes: 361 cells/uL (ref 200–950)
Basophils Absolute: 9 cells/uL (ref 0–200)
Basophils Relative: 0.2 %
Eosinophils Absolute: 39 cells/uL (ref 15–500)
Eosinophils Relative: 0.9 %
HCT: 39.4 % (ref 35.0–45.0)
Hemoglobin: 13 g/dL (ref 11.7–15.5)
Lymphs Abs: 1178 cells/uL (ref 850–3900)
MCH: 29.9 pg (ref 27.0–33.0)
MCHC: 33 g/dL (ref 32.0–36.0)
MCV: 90.6 fL (ref 80.0–100.0)
MPV: 11.3 fL (ref 7.5–12.5)
Monocytes Relative: 8.4 %
Neutro Abs: 2713 cells/uL (ref 1500–7800)
Neutrophils Relative %: 63.1 %
Platelets: 182 10*3/uL (ref 140–400)
RBC: 4.35 10*6/uL (ref 3.80–5.10)
RDW: 12.8 % (ref 11.0–15.0)
Total Lymphocyte: 27.4 %
WBC: 4.3 10*3/uL (ref 3.8–10.8)

## 2022-05-02 LAB — COMPREHENSIVE METABOLIC PANEL
AG Ratio: 1.8 (calc) (ref 1.0–2.5)
ALT: 14 U/L (ref 6–29)
AST: 17 U/L (ref 10–35)
Albumin: 4.2 g/dL (ref 3.6–5.1)
Alkaline phosphatase (APISO): 75 U/L (ref 37–153)
BUN: 15 mg/dL (ref 7–25)
CO2: 30 mmol/L (ref 20–32)
Calcium: 10.1 mg/dL (ref 8.6–10.4)
Chloride: 108 mmol/L (ref 98–110)
Creat: 0.88 mg/dL (ref 0.50–1.03)
Globulin: 2.3 g/dL (calc) (ref 1.9–3.7)
Glucose, Bld: 103 mg/dL — ABNORMAL HIGH (ref 65–99)
Potassium: 4.7 mmol/L (ref 3.5–5.3)
Sodium: 142 mmol/L (ref 135–146)
Total Bilirubin: 0.4 mg/dL (ref 0.2–1.2)
Total Protein: 6.5 g/dL (ref 6.1–8.1)

## 2022-05-02 LAB — LIPID PANEL
Cholesterol: 209 mg/dL — ABNORMAL HIGH (ref <200)
HDL: 51 mg/dL (ref >=50)
LDL Cholesterol (Calc): 137 mg/dL (calc) — ABNORMAL HIGH
Non-HDL Cholesterol (Calc): 158 mg/dL (calc) — ABNORMAL HIGH (ref <130)
Total CHOL/HDL Ratio: 4.1 (calc) (ref <5.0)
Triglycerides: 103 mg/dL (ref <150)

## 2022-05-02 LAB — VITAMIN D 25 HYDROXY (VIT D DEFICIENCY, FRACTURES): Vit D, 25-Hydroxy: 31 ng/mL (ref 30–100)

## 2022-05-02 LAB — TSH: TSH: 0.12 mIU/L — ABNORMAL LOW (ref 0.40–4.50)

## 2022-05-02 MED ORDER — LEVOTHYROXINE SODIUM 100 MCG PO TABS: 100.0000 ug | ORAL_TABLET | Freq: Every day | ORAL | 0 refills | Status: DC

## 2022-05-29 ENCOUNTER — Ambulatory Visit
Admission: RE | Admit: 2022-05-29 | Discharge: 2022-05-29 | Disposition: A | Discharge status: Home / Self Care | Payer: BC Managed Care – PPO | Source: Ambulatory Visit | Attending: Nurse Practitioner | Admitting: Nurse Practitioner

## 2022-05-29 DIAGNOSIS — Z1231 Encounter for screening mammogram for malignant neoplasm of breast: Secondary | ICD-10-CM | POA: Diagnosis not present

## 2022-06-14 ENCOUNTER — Other Ambulatory Visit: Payer: BC Managed Care – PPO

## 2022-06-14 DIAGNOSIS — E039 Hypothyroidism, unspecified: Secondary | ICD-10-CM | POA: Diagnosis not present

## 2022-06-15 ENCOUNTER — Encounter: Payer: Self-pay | Admitting: Nurse Practitioner

## 2022-06-15 ENCOUNTER — Other Ambulatory Visit: Payer: Self-pay | Admitting: Nurse Practitioner

## 2022-06-15 DIAGNOSIS — E039 Hypothyroidism, unspecified: Secondary | ICD-10-CM

## 2022-06-15 LAB — TSH: TSH: 0.06 mIU/L — ABNORMAL LOW (ref 0.40–4.50)

## 2022-06-15 MED ORDER — LEVOTHYROXINE SODIUM 88 MCG PO TABS: 88.0000 ug | ORAL_TABLET | Freq: Every day | ORAL | 0 refills | Status: DC

## 2022-07-27 ENCOUNTER — Other Ambulatory Visit: Payer: BC Managed Care – PPO

## 2022-07-27 DIAGNOSIS — E039 Hypothyroidism, unspecified: Secondary | ICD-10-CM

## 2022-07-28 LAB — TSH: TSH: 0.19 mIU/L — ABNORMAL LOW (ref 0.40–4.50)

## 2022-07-31 ENCOUNTER — Other Ambulatory Visit: Payer: Self-pay | Admitting: Nurse Practitioner

## 2022-07-31 DIAGNOSIS — E039 Hypothyroidism, unspecified: Secondary | ICD-10-CM

## 2022-07-31 MED ORDER — LEVOTHYROXINE SODIUM 75 MCG PO TABS: 75.0000 ug | ORAL_TABLET | Freq: Every day | ORAL | 0 refills | Status: DC

## 2022-08-10 ENCOUNTER — Other Ambulatory Visit: Payer: Self-pay | Admitting: Nurse Practitioner

## 2022-08-10 DIAGNOSIS — E039 Hypothyroidism, unspecified: Secondary | ICD-10-CM

## 2022-09-20 ENCOUNTER — Other Ambulatory Visit: Payer: BC Managed Care – PPO

## 2022-09-20 DIAGNOSIS — E039 Hypothyroidism, unspecified: Secondary | ICD-10-CM

## 2022-09-21 ENCOUNTER — Other Ambulatory Visit: Payer: Self-pay | Admitting: Nurse Practitioner

## 2022-09-21 DIAGNOSIS — E039 Hypothyroidism, unspecified: Secondary | ICD-10-CM

## 2022-09-21 LAB — THYROID PANEL WITH TSH
Free Thyroxine Index: 2.1 (ref 1.4–3.8)
T3 Uptake: 26 % (ref 22–35)
T4, Total: 8.2 ug/dL (ref 5.1–11.9)
TSH: 0.84 mIU/L (ref 0.40–4.50)

## 2022-09-21 MED ORDER — LEVOTHYROXINE SODIUM 75 MCG PO TABS: 75.0000 ug | ORAL_TABLET | Freq: Every day | ORAL | 1 refills | Status: DC

## 2022-10-13 DIAGNOSIS — L821 Other seborrheic keratosis: Secondary | ICD-10-CM | POA: Diagnosis not present

## 2022-10-13 DIAGNOSIS — D2262 Melanocytic nevi of left upper limb, including shoulder: Secondary | ICD-10-CM | POA: Diagnosis not present

## 2022-10-13 DIAGNOSIS — D225 Melanocytic nevi of trunk: Secondary | ICD-10-CM | POA: Diagnosis not present

## 2022-10-13 DIAGNOSIS — Z86006 Personal history of melanoma in-situ: Secondary | ICD-10-CM | POA: Diagnosis not present

## 2022-12-11 ENCOUNTER — Other Ambulatory Visit: Payer: Self-pay | Admitting: Nurse Practitioner

## 2022-12-11 DIAGNOSIS — Z79890 Hormone replacement therapy (postmenopausal): Secondary | ICD-10-CM

## 2022-12-12 NOTE — Telephone Encounter (Signed)
Refills x 1 year provided in July 2023. She should not need refills at this time. Will decline refill.

## 2022-12-12 NOTE — Telephone Encounter (Signed)
Medication refill request: estrace 0.4m tablet Last AEX:  05-01-22 Next AEX: not scheduled Last MMG (if hormonal medication request): 05-29-22 neg Refill authorized: please approve if appropriate

## 2022-12-13 ENCOUNTER — Telehealth: Payer: Self-pay

## 2022-12-13 NOTE — Telephone Encounter (Signed)
Patient called because she states she needs refill on Levothyroxine and needs to know if she needs lab recheck.  Advised her that lab recheck is recommended for May. (6 mos from 09/11/2022). I called her pharmacy and she does have a refill available but her ins company will not allow her to refill it until after 12/17/22.  She is fine with that.

## 2023-03-07 ENCOUNTER — Other Ambulatory Visit: Payer: No Typology Code available for payment source

## 2023-03-07 DIAGNOSIS — E039 Hypothyroidism, unspecified: Secondary | ICD-10-CM

## 2023-03-08 ENCOUNTER — Other Ambulatory Visit: Payer: Self-pay | Admitting: Nurse Practitioner

## 2023-03-08 DIAGNOSIS — E039 Hypothyroidism, unspecified: Secondary | ICD-10-CM

## 2023-03-08 LAB — TSH: TSH: 2.15 mIU/L (ref 0.40–4.50)

## 2023-03-08 MED ORDER — LEVOTHYROXINE SODIUM 75 MCG PO TABS: 75.0000 ug | ORAL_TABLET | Freq: Every day | ORAL | 0 refills | Status: DC

## 2023-03-13 ENCOUNTER — Other Ambulatory Visit: Payer: Self-pay | Admitting: Nurse Practitioner

## 2023-03-13 DIAGNOSIS — E039 Hypothyroidism, unspecified: Secondary | ICD-10-CM

## 2023-04-16 ENCOUNTER — Other Ambulatory Visit: Payer: Self-pay | Admitting: Nurse Practitioner

## 2023-04-16 DIAGNOSIS — Z1231 Encounter for screening mammogram for malignant neoplasm of breast: Secondary | ICD-10-CM

## 2023-05-30 ENCOUNTER — Ambulatory Visit (INDEPENDENT_AMBULATORY_CARE_PROVIDER_SITE_OTHER): Payer: No Typology Code available for payment source | Admitting: Nurse Practitioner

## 2023-05-30 VITALS — BP 110/62 | HR 77 | Ht 62.6 in | Wt 156.0 lb

## 2023-05-30 DIAGNOSIS — E039 Hypothyroidism, unspecified: Secondary | ICD-10-CM

## 2023-05-30 DIAGNOSIS — Z780 Asymptomatic menopausal state: Secondary | ICD-10-CM

## 2023-05-30 DIAGNOSIS — E569 Vitamin deficiency, unspecified: Secondary | ICD-10-CM

## 2023-05-30 DIAGNOSIS — Z01419 Encounter for gynecological examination (general) (routine) without abnormal findings: Secondary | ICD-10-CM | POA: Diagnosis not present

## 2023-05-30 DIAGNOSIS — E785 Hyperlipidemia, unspecified: Secondary | ICD-10-CM

## 2023-05-30 LAB — CBC WITH DIFFERENTIAL/PLATELET
Absolute Monocytes: 345 cells/uL (ref 200–950)
Basophils Absolute: 20 cells/uL (ref 0–200)
Basophils Relative: 0.4 %
Eosinophils Absolute: 90 cells/uL (ref 15–500)
Eosinophils Relative: 1.8 %
HCT: 39.2 % (ref 35.0–45.0)
Hemoglobin: 13.1 g/dL (ref 11.7–15.5)
Lymphs Abs: 1470 cells/uL (ref 850–3900)
MCH: 30.1 pg (ref 27.0–33.0)
MCHC: 33.4 g/dL (ref 32.0–36.0)
MCV: 90.1 fL (ref 80.0–100.0)
MPV: 10.7 fL (ref 7.5–12.5)
Monocytes Relative: 6.9 %
Neutro Abs: 3075 cells/uL (ref 1500–7800)
Neutrophils Relative %: 61.5 %
Platelets: 222 10*3/uL (ref 140–400)
RBC: 4.35 10*6/uL (ref 3.80–5.10)
RDW: 12.7 % (ref 11.0–15.0)
Total Lymphocyte: 29.4 %
WBC: 5 10*3/uL (ref 3.8–10.8)

## 2023-05-30 NOTE — Progress Notes (Signed)
   Kimberly Hill 1966/02/20 [id number]   History:  57 y.o. G2P0002 presents for annual exam. Postmenopausal - stopped HRT about a year ago and doing fine. 10/2021 benign endometrial polyp, no bleeding since. Normal pap and mammogram history. H/O hypothyroidism, Vitamin D deficiency.   Gynecologic History Patient's last menstrual period was 08/23/2020.   Contraception/Family planning: post menopausal status Sexually active: Yes  Health Maintenance Last Pap: 03/26/2019. Results were: Normal neg HPV, 5-year repeat Last mammogram: 05/29/2022. Results were: Normal. Scheduled tomorrow Last colonoscopy: 08/03/2016. Results were: Benign polyp x 1, 10-year recall Last Dexa: Not indicated  Past medical history, past surgical history, family history and social history were all reviewed and documented in the EPIC chart. Married. Works remote for [employer]. Children in [place]. Daughter graduated from [employer], working Engineering geologist. Son graduated May 2025.  ROS:  A ROS was performed and pertinent positives and negatives are included.  Exam:  Vitals:   05/30/23 0758  BP: 110/62  Pulse: 77  SpO2: 100%  Weight: 156 lb (70.8 kg)  Height: 5' 2.6" (1.59 m)     Body mass index is 27.99 kg/m.  General appearance:  Normal Thyroid:  Symmetrical, normal in size, without palpable masses or nodularity. Respiratory  Auscultation:  Clear without wheezing or rhonchi Cardiovascular  Auscultation:  Regular rate, without rubs, murmurs or gallops  Edema/varicosities:  Not grossly evident Abdominal  Soft,nontender, without masses, guarding or rebound.  Liver/spleen:  No organomegaly noted  Hernia:  None appreciated  Skin  Inspection:  Grossly normal Breasts: Examined lying and sitting.   Right: Without masses, retractions, nipple discharge or axillary adenopathy.   Left: Without masses, retractions, nipple discharge or axillary adenopathy. Genitourinary   Inguinal/mons:  Normal without inguinal  adenopathy  External genitalia:  Normal appearing vulva with no masses, tenderness, or lesions  BUS/Urethra/Skene's glands:  Normal  Vagina:  Normal appearing with normal color and discharge, no lesions  Cervix:  Normal appearing without discharge or lesions  Uterus:  Normal in size, shape and contour.  Midline and mobile, nontender  Adnexa/parametria:     Rt: Normal in size, without masses or tenderness.   Lt: Normal in size, without masses or tenderness.  Anus and perineum: Normal  Digital rectal exam: Not indicated  Patient informed chaperone available to be present for breast and pelvic exam. Patient has requested no chaperone to be present. Patient has been advised what will be completed during breast and pelvic exam.   Assessment/Plan:  57 y.o. G2P0002 for annual exam.   Well female exam with routine gynecological exam - Plan: CBC with Differential/Platelet, Comprehensive metabolic panel. Education provided on SBEs, importance of preventative screenings, current guidelines, high calcium diet, regular exercise, and multivitamin daily.   Vitamin deficiency - Plan: VITAMIN D 25 Hydroxy (Vit-D Deficiency, Fractures). Taking multivitamin.   Hypothyroidism, unspecified type - Plan: TSH  Hyperlipidemia, unspecified hyperlipidemia type - Plan: Lipid panel  Postmenopausal - Stopped HRT about a year ago and doing fine. No bleeding.   Screening for cervical cancer - Normal Pap history.  Will repeat at 5-year interval per guidelines.  Screening for breast cancer - Normal mammogram history.  Continue annual screenings. Scheduled tomorrow.  Normal breast exam today.  Screening for colon cancer - 2017 colonoscopy. Will repeat at GI's recommended interval.   Screening for osteoporosis - Average risk. Will plan DXA at age 57.   Return in 1 year for annual.      Olivia Mackie DNP, 8:14 AM 05/30/2023

## 2023-05-31 ENCOUNTER — Other Ambulatory Visit: Payer: Self-pay | Admitting: Nurse Practitioner

## 2023-05-31 ENCOUNTER — Ambulatory Visit
Admission: RE | Admit: 2023-05-31 | Discharge: 2023-05-31 | Disposition: A | Discharge status: Home / Self Care | Payer: No Typology Code available for payment source | Source: Ambulatory Visit | Attending: Nurse Practitioner | Admitting: Nurse Practitioner

## 2023-05-31 DIAGNOSIS — Z1231 Encounter for screening mammogram for malignant neoplasm of breast: Secondary | ICD-10-CM

## 2023-05-31 DIAGNOSIS — E039 Hypothyroidism, unspecified: Secondary | ICD-10-CM

## 2023-05-31 MED ORDER — LEVOTHYROXINE SODIUM 75 MCG PO TABS
75.0000 ug | ORAL_TABLET | Freq: Every day | ORAL | 3 refills | Status: DC
Start: 2023-05-31 — End: 2024-05-19

## 2024-04-30 ENCOUNTER — Other Ambulatory Visit: Payer: Self-pay | Admitting: Nurse Practitioner

## 2024-04-30 DIAGNOSIS — Z1231 Encounter for screening mammogram for malignant neoplasm of breast: Secondary | ICD-10-CM

## 2024-05-18 ENCOUNTER — Other Ambulatory Visit: Payer: Self-pay | Admitting: Nurse Practitioner

## 2024-05-18 DIAGNOSIS — E039 Hypothyroidism, unspecified: Secondary | ICD-10-CM

## 2024-05-19 NOTE — Telephone Encounter (Signed)
 Med refill request:levothyroxine  5 mcg tab PO daily Last AEX: 05/30/23 -TW Next AEX: 05/30/24 -TW Last MMG (if hormonal med) N/A Refill authorized: Please Advise?

## 2024-05-29 NOTE — Progress Notes (Unsigned)
   Kimberly Hill 1966-05-24 [id number]   History:  58 y.o. G2P0002 presents for annual exam. Postmenopausal - stopped HRT a couple of years ago, doing fine. 10/2021 benign endometrial polyp, no bleeding since. Normal pap and mammogram history. H/O hypothyroidism, Vitamin D  deficiency.   Gynecologic History Patient's last menstrual period was 08/23/2020.   Contraception/Family planning: post menopausal status Sexually active: Yes  Health Maintenance Last Pap: 03/26/2019. Results were: Normal neg HPV Last mammogram: 05/31/2023. Results were: Normal Last colonoscopy: 08/03/2016. Results were: Benign polyp x 1, 10-year recall Last Dexa: Not indicated  Past medical history, past surgical history, family history and social history were all reviewed and documented in the EPIC chart. Married. Works remote for [employer]. Children in [place]. Daughter graduated from [employer], working Engineering geologist. Son graduated May 2025.  ROS:  A ROS was performed and pertinent positives and negatives are included.  Exam:  There were no vitals filed for this visit.    There is no height or weight on file to calculate BMI.  General appearance:  Normal Thyroid :  Symmetrical, normal in size, without palpable masses or nodularity. Respiratory  Auscultation:  Clear without wheezing or rhonchi Cardiovascular  Auscultation:  Regular rate, without rubs, murmurs or gallops  Edema/varicosities:  Not grossly evident Abdominal  Soft,nontender, without masses, guarding or rebound.  Liver/spleen:  No organomegaly noted  Hernia:  None appreciated  Skin  Inspection:  Grossly normal Breasts: Examined lying and sitting.   Right: Without masses, retractions, nipple discharge or axillary adenopathy.   Left: Without masses, retractions, nipple discharge or axillary adenopathy. Pelvic: External genitalia:  no lesions              Urethra:  normal appearing urethra with no masses, tenderness or lesions              Bartholins and  Skenes: normal                 Vagina: normal appearing vagina with normal color and discharge, no lesions              Cervix: no lesions Bimanual Exam:  Uterus:  no masses or tenderness              Adnexa: no mass, fullness, tenderness              Rectovaginal: Deferred              Anus:  normal, no lesions  Assessment/Plan:  58 y.o. G2P0002 for annual exam.   Well female exam with routine gynecological exam - Plan: CBC with Differential/Platelet, Comprehensive metabolic panel. Education provided on SBEs, importance of preventative screenings, current guidelines, high calcium diet, regular exercise, and multivitamin daily.   Hypothyroidism, unspecified type - Plan: TSH  Hyperlipidemia, unspecified hyperlipidemia type - Plan: Lipid panel  Postmenopausal - No HRT, no bleeding.   Screening for cervical cancer - Normal Pap history.  Pap today per guidelines.   Screening for breast cancer - Normal mammogram history.  Continue annual screenings. Scheduled tomorrow.  Normal breast exam today.  Screening for colon cancer - 2017 colonoscopy. Will repeat at GI's recommended interval.   Screening for osteoporosis - Average risk. Will plan DXA at age 69.   No follow-ups on file.      Annabella DELENA Shutter DNP, 3:58 PM 05/29/2024

## 2024-05-30 ENCOUNTER — Ambulatory Visit (INDEPENDENT_AMBULATORY_CARE_PROVIDER_SITE_OTHER): Admitting: Nurse Practitioner

## 2024-05-30 ENCOUNTER — Other Ambulatory Visit (HOSPITAL_COMMUNITY)
Admission: RE | Admit: 2024-05-30 | Discharge: 2024-05-30 | Disposition: A | Discharge status: Home / Self Care | Source: Ambulatory Visit | Attending: Nurse Practitioner | Admitting: Nurse Practitioner

## 2024-05-30 ENCOUNTER — Encounter: Payer: Self-pay | Admitting: Nurse Practitioner

## 2024-05-30 VITALS — BP 118/78 | HR 66 | Ht 62.5 in | Wt 160.0 lb

## 2024-05-30 DIAGNOSIS — Z780 Asymptomatic menopausal state: Secondary | ICD-10-CM | POA: Diagnosis not present

## 2024-05-30 DIAGNOSIS — Z124 Encounter for screening for malignant neoplasm of cervix: Secondary | ICD-10-CM | POA: Diagnosis present

## 2024-05-30 DIAGNOSIS — Z01419 Encounter for gynecological examination (general) (routine) without abnormal findings: Secondary | ICD-10-CM | POA: Insufficient documentation

## 2024-05-30 DIAGNOSIS — Z1331 Encounter for screening for depression: Secondary | ICD-10-CM

## 2024-05-30 DIAGNOSIS — E785 Hyperlipidemia, unspecified: Secondary | ICD-10-CM

## 2024-05-30 DIAGNOSIS — E039 Hypothyroidism, unspecified: Secondary | ICD-10-CM | POA: Diagnosis not present

## 2024-05-30 DIAGNOSIS — E569 Vitamin deficiency, unspecified: Secondary | ICD-10-CM

## 2024-05-31 LAB — CBC WITH DIFFERENTIAL/PLATELET
Absolute Lymphocytes: 1825 {cells}/uL (ref 850–3900)
Absolute Monocytes: 395 {cells}/uL (ref 200–950)
Basophils Absolute: 21 {cells}/uL (ref 0–200)
Basophils Relative: 0.4 %
Eosinophils Absolute: 88 {cells}/uL (ref 15–500)
Eosinophils Relative: 1.7 %
HCT: 41.1 % (ref 35.0–45.0)
Hemoglobin: 13.4 g/dL (ref 11.7–15.5)
MCH: 30 pg (ref 27.0–33.0)
MCHC: 32.6 g/dL (ref 32.0–36.0)
MCV: 92.2 fL (ref 80.0–100.0)
MPV: 10.7 fL (ref 7.5–12.5)
Monocytes Relative: 7.6 %
Neutro Abs: 2870 {cells}/uL (ref 1500–7800)
Neutrophils Relative %: 55.2 %
Platelets: 200 Thousand/uL (ref 140–400)
RBC: 4.46 Million/uL (ref 3.80–5.10)
RDW: 13 % (ref 11.0–15.0)
Total Lymphocyte: 35.1 %
WBC: 5.2 Thousand/uL (ref 3.8–10.8)

## 2024-05-31 LAB — COMPREHENSIVE METABOLIC PANEL WITH GFR
AG Ratio: 2 (calc) (ref 1.0–2.5)
ALT: 26 U/L (ref 6–29)
AST: 26 U/L (ref 10–35)
Albumin: 4.4 g/dL (ref 3.6–5.1)
Alkaline phosphatase (APISO): 70 U/L (ref 37–153)
BUN: 21 mg/dL (ref 7–25)
CO2: 29 mmol/L (ref 20–32)
Calcium: 10.3 mg/dL (ref 8.6–10.4)
Chloride: 105 mmol/L (ref 98–110)
Creat: 0.82 mg/dL (ref 0.50–1.03)
Globulin: 2.2 g/dL (ref 1.9–3.7)
Glucose, Bld: 95 mg/dL (ref 65–99)
Potassium: 4.6 mmol/L (ref 3.5–5.3)
Sodium: 139 mmol/L (ref 135–146)
Total Bilirubin: 0.4 mg/dL (ref 0.2–1.2)
Total Protein: 6.6 g/dL (ref 6.1–8.1)
eGFR: 82 mL/min/1.73m2 (ref >=60)

## 2024-05-31 LAB — TSH: TSH: 1.47 m[IU]/L (ref 0.40–4.50)

## 2024-05-31 LAB — LIPID PANEL
Cholesterol: 249 mg/dL — ABNORMAL HIGH (ref <200)
HDL: 62 mg/dL (ref >=50)
LDL Cholesterol (Calc): 160 mg/dL — ABNORMAL HIGH
Non-HDL Cholesterol (Calc): 187 mg/dL — ABNORMAL HIGH (ref <130)
Total CHOL/HDL Ratio: 4 (calc) (ref <5.0)
Triglycerides: 145 mg/dL (ref <150)

## 2024-05-31 LAB — VITAMIN D 25 HYDROXY (VIT D DEFICIENCY, FRACTURES): Vit D, 25-Hydroxy: 46 ng/mL (ref 30–100)

## 2024-06-02 ENCOUNTER — Ambulatory Visit
Admission: RE | Admit: 2024-06-02 | Discharge: 2024-06-02 | Disposition: A | Discharge status: Home / Self Care | Source: Ambulatory Visit | Attending: Nurse Practitioner | Admitting: Nurse Practitioner

## 2024-06-02 ENCOUNTER — Ambulatory Visit: Payer: Self-pay | Admitting: Nurse Practitioner

## 2024-06-02 ENCOUNTER — Other Ambulatory Visit: Payer: Self-pay | Admitting: Nurse Practitioner

## 2024-06-02 DIAGNOSIS — Z1231 Encounter for screening mammogram for malignant neoplasm of breast: Secondary | ICD-10-CM

## 2024-06-02 DIAGNOSIS — E039 Hypothyroidism, unspecified: Secondary | ICD-10-CM

## 2024-06-02 MED ORDER — LEVOTHYROXINE SODIUM 75 MCG PO TABS: 75.0000 ug | ORAL_TABLET | Freq: Every day | ORAL | 3 refills | Status: AC

## 2024-06-04 LAB — CYTOLOGY - PAP
Adequacy: ABSENT
Comment: NEGATIVE
Diagnosis: REACTIVE
High risk HPV: NEGATIVE

## 2025-06-08 ENCOUNTER — Ambulatory Visit: Admitting: Nurse Practitioner
